# Patient Record
Sex: Female | Born: 2000 | Race: White | Hispanic: No | Marital: Single | Smoking: Never smoker
Health system: Southern US, Community
[De-identification: ages and names within clinical notes are randomized; demographics above are authoritative.]

## PROBLEM LIST (undated history)

## (undated) ENCOUNTER — Emergency Department (HOSPITAL_COMMUNITY): Admission: EM | Payer: Medicaid Other | Source: Home / Self Care

## (undated) DIAGNOSIS — Q100 Congenital ptosis: Secondary | ICD-10-CM

## (undated) DIAGNOSIS — IMO0002 Reserved for concepts with insufficient information to code with codable children: Secondary | ICD-10-CM

## (undated) DIAGNOSIS — F32A Depression, unspecified: Secondary | ICD-10-CM

## (undated) DIAGNOSIS — M419 Scoliosis, unspecified: Secondary | ICD-10-CM

## (undated) DIAGNOSIS — F909 Attention-deficit hyperactivity disorder, unspecified type: Secondary | ICD-10-CM

## (undated) DIAGNOSIS — F840 Autistic disorder: Secondary | ICD-10-CM

## (undated) DIAGNOSIS — F4310 Post-traumatic stress disorder, unspecified: Secondary | ICD-10-CM

## (undated) DIAGNOSIS — F329 Major depressive disorder, single episode, unspecified: Secondary | ICD-10-CM

## (undated) DIAGNOSIS — K219 Gastro-esophageal reflux disease without esophagitis: Secondary | ICD-10-CM

## (undated) DIAGNOSIS — K2970 Gastritis, unspecified, without bleeding: Secondary | ICD-10-CM

## (undated) DIAGNOSIS — F82 Specific developmental disorder of motor function: Secondary | ICD-10-CM

## (undated) DIAGNOSIS — H02409 Unspecified ptosis of unspecified eyelid: Secondary | ICD-10-CM

## (undated) DIAGNOSIS — E43 Unspecified severe protein-calorie malnutrition: Secondary | ICD-10-CM

## (undated) DIAGNOSIS — F509 Eating disorder, unspecified: Secondary | ICD-10-CM

## (undated) HISTORY — DX: Reserved for concepts with insufficient information to code with codable children: IMO0002

## (undated) HISTORY — PX: BELPHAROPTOSIS REPAIR: SHX369

## (undated) HISTORY — PX: OTHER SURGICAL HISTORY: SHX169

## (undated) HISTORY — PX: TYMPANOSTOMY TUBE PLACEMENT: SHX32

## (undated) HISTORY — PX: SPINE SURGERY: SHX786

## (undated) HISTORY — DX: Attention-deficit hyperactivity disorder, unspecified type: F90.9

## (undated) HISTORY — DX: Unspecified severe protein-calorie malnutrition: E43

## (undated) HISTORY — DX: Post-traumatic stress disorder, unspecified: F43.10

## (undated) HISTORY — DX: Gastritis, unspecified, without bleeding: K29.70

## (undated) HISTORY — DX: Unspecified ptosis of unspecified eyelid: H02.409

## (undated) HISTORY — DX: Autistic disorder: F84.0

## (undated) HISTORY — DX: Gastro-esophageal reflux disease without esophagitis: K21.9

## (undated) HISTORY — DX: Eating disorder, unspecified: F50.9

## (undated) HISTORY — PX: EYE SURGERY: SHX253

## (undated) HISTORY — DX: Specific developmental disorder of motor function: F82

## (undated) HISTORY — DX: Depression, unspecified: F32.A

## (undated) HISTORY — DX: Congenital ptosis: Q10.0

---
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
## 2021-01-03 ENCOUNTER — Telehealth: Payer: Self-pay | Admitting: Pediatrics

## 2021-01-03 NOTE — Telephone Encounter (Signed)
Mom would like a call back. She has various questions.

## 2021-01-04 ENCOUNTER — Ambulatory Visit: Payer: Medicaid Other

## 2021-01-04 ENCOUNTER — Other Ambulatory Visit: Payer: Self-pay

## 2021-01-04 NOTE — Telephone Encounter (Signed)
Called number on file, no answer, left VM to call office back. ° °

## 2021-01-04 NOTE — Telephone Encounter (Signed)
Spoke with parent. Was unable to fill script due to ipledge. Time has lapsed- needs new urine preg. Appointment made for lab visit for urine preg and labs.

## 2021-01-05 ENCOUNTER — Other Ambulatory Visit: Payer: Self-pay | Admitting: Pediatrics

## 2021-01-05 DIAGNOSIS — L700 Acne vulgaris: Secondary | ICD-10-CM

## 2021-01-05 MED ORDER — ISOTRETINOIN 30 MG PO CAPS: 30.0000 mg | ORAL_CAPSULE | Freq: Two times a day (BID) | ORAL | 0 refills | Status: DC

## 2021-01-05 NOTE — Telephone Encounter (Signed)
Urine was completed. Rx re-sent and ipledge system updated.

## 2021-01-06 ENCOUNTER — Telehealth: Payer: Self-pay | Admitting: Pediatrics

## 2021-01-06 DIAGNOSIS — R000 Tachycardia, unspecified: Secondary | ICD-10-CM

## 2021-01-06 NOTE — Telephone Encounter (Signed)
Spoke with mom. She picked up Accutane yesterday for patient.

## 2021-01-06 NOTE — Telephone Encounter (Signed)
Referral has been placed in epic 

## 2021-01-06 NOTE — Telephone Encounter (Signed)
Imo was seen by Dr. Armanda Magic who requested referral to cardiac electrophysiology for further evaluation of inappropriate sinus tachycardia. Referred to [facility] cardiac electrophysiology.

## 2021-01-07 LAB — COMPREHENSIVE METABOLIC PANEL
AG Ratio: 1.8 (calc) (ref 1.0–2.5)
ALT: 27 U/L (ref 5–32)
AST: 26 U/L (ref 12–32)
Albumin: 4.3 g/dL (ref 3.6–5.1)
Alkaline phosphatase (APISO): 96 U/L (ref 36–128)
BUN: 17 mg/dL (ref 7–20)
CO2: 20 mmol/L (ref 20–32)
Calcium: 9.4 mg/dL (ref 8.9–10.4)
Chloride: 110 mmol/L (ref 98–110)
Creat: 0.88 mg/dL (ref 0.50–1.00)
Globulin: 2.4 g/dL (calc) (ref 2.0–3.8)
Glucose, Bld: 91 mg/dL (ref 65–99)
Potassium: 4 mmol/L (ref 3.8–5.1)
Sodium: 141 mmol/L (ref 135–146)
Total Bilirubin: 0.4 mg/dL (ref 0.2–1.1)
Total Protein: 6.7 g/dL (ref 6.3–8.2)

## 2021-01-07 LAB — T4, FREE: Free T4: 1.1 ng/dL (ref 0.8–1.4)

## 2021-01-07 LAB — PREGNANCY, URINE: Preg Test, Ur: NEGATIVE

## 2021-01-07 LAB — TRIGLYCERIDES: Triglycerides: 102 mg/dL — ABNORMAL HIGH (ref <90)

## 2021-01-07 LAB — THYROGLOBULIN ANTIBODY: Thyroglobulin Ab: 1 IU/mL (ref <=1)

## 2021-01-07 LAB — TSH: TSH: 3.02 mIU/L

## 2021-01-07 LAB — THYROID STIMULATING IMMUNOGLOBULIN: TSI: <89 % baseline (ref <140)

## 2021-01-07 LAB — THYROID PEROXIDASE ANTIBODIES (TPO) (REFL): Thyroperoxidase Ab SerPl-aCnc: 4 IU/mL (ref <9)

## 2021-01-11 ENCOUNTER — Other Ambulatory Visit: Payer: Self-pay

## 2021-01-11 ENCOUNTER — Ambulatory Visit (INDEPENDENT_AMBULATORY_CARE_PROVIDER_SITE_OTHER): Payer: Medicaid Other

## 2021-01-11 DIAGNOSIS — Z3042 Encounter for surveillance of injectable contraceptive: Secondary | ICD-10-CM

## 2021-01-11 MED ORDER — MEDROXYPROGESTERONE ACETATE 150 MG/ML IM SUSP
150.0000 mg | Freq: Once | INTRAMUSCULAR | Status: AC
  Administered 2021-01-11: 150 mg via INTRAMUSCULAR

## 2021-01-11 NOTE — Progress Notes (Signed)
Pt presents for depo injection. Pt within depo window, no urine hcg needed. Injection given, tolerated well. F/u depo injection visit scheduled.   

## 2021-01-24 ENCOUNTER — Other Ambulatory Visit: Payer: Self-pay

## 2021-01-24 ENCOUNTER — Other Ambulatory Visit: Payer: Self-pay | Admitting: Pediatrics

## 2021-01-24 ENCOUNTER — Ambulatory Visit (INDEPENDENT_AMBULATORY_CARE_PROVIDER_SITE_OTHER): Payer: Medicaid Other

## 2021-01-24 DIAGNOSIS — Z3202 Encounter for pregnancy test, result negative: Secondary | ICD-10-CM

## 2021-01-24 DIAGNOSIS — L700 Acne vulgaris: Secondary | ICD-10-CM

## 2021-01-24 LAB — POCT URINE PREGNANCY: Preg Test, Ur: NEGATIVE

## 2021-01-24 MED ORDER — ISOTRETINOIN 30 MG PO CAPS: 30.0000 mg | ORAL_CAPSULE | Freq: Two times a day (BID) | ORAL | 0 refills | Status: DC

## 2021-01-24 NOTE — Progress Notes (Signed)
Pt here for urine pregnancy for accutane therapy. Specimen obtained and negative. Sending to [facility] to update ipledge.

## 2021-01-25 ENCOUNTER — Institutional Professional Consult (permissible substitution): Payer: Medicaid Other | Admitting: Internal Medicine

## 2021-02-01 ENCOUNTER — Telehealth: Payer: Self-pay | Admitting: *Deleted

## 2021-02-01 DIAGNOSIS — I771 Stricture of artery: Secondary | ICD-10-CM

## 2021-02-01 DIAGNOSIS — R000 Tachycardia, unspecified: Secondary | ICD-10-CM

## 2021-02-01 DIAGNOSIS — Z01812 Encounter for preprocedural laboratory examination: Secondary | ICD-10-CM

## 2021-02-01 NOTE — Telephone Encounter (Signed)
Hey Carlyle/Triage  Could you reach out to see if this patient can get a CBC for this cardiac MRI on Thursday, July 7?  Dr. Bjorn Pippin is asking for them so he can more accurately do his measurements on MRIs.   Thanks,  Alphonsa Overall with pt's mother [name] - DPR on file.  She is aware of need for lab work and to come in on Thursday.  She is aware of location of office.

## 2021-02-02 ENCOUNTER — Telehealth (HOSPITAL_COMMUNITY): Payer: Self-pay | Admitting: *Deleted

## 2021-02-02 NOTE — Telephone Encounter (Signed)
Attempted to call patient regarding upcoming cardiac MRI appointment. Left message on voicemail with name and callback number  Myli Pae RN Navigator Cardiac Imaging [facility] [phone] Office [phone] Cell  

## 2021-02-03 ENCOUNTER — Ambulatory Visit (HOSPITAL_COMMUNITY)
Admission: RE | Admit: 2021-02-03 | Discharge: 2021-02-03 | Disposition: A | Discharge status: Home / Self Care | Payer: Medicaid Other | Source: Ambulatory Visit | Attending: Cardiology | Admitting: Cardiology

## 2021-02-03 ENCOUNTER — Other Ambulatory Visit: Payer: Self-pay

## 2021-02-03 ENCOUNTER — Other Ambulatory Visit: Payer: Medicaid Other

## 2021-02-03 DIAGNOSIS — Z01812 Encounter for preprocedural laboratory examination: Secondary | ICD-10-CM

## 2021-02-03 DIAGNOSIS — Q676 Pectus excavatum: Secondary | ICD-10-CM | POA: Insufficient documentation

## 2021-02-03 DIAGNOSIS — R000 Tachycardia, unspecified: Secondary | ICD-10-CM

## 2021-02-03 DIAGNOSIS — I771 Stricture of artery: Secondary | ICD-10-CM

## 2021-02-03 LAB — CBC
Hematocrit: 41 % (ref 34.0–46.6)
Hemoglobin: 13.5 g/dL (ref 11.1–15.9)
MCH: 25.7 pg — ABNORMAL LOW (ref 26.6–33.0)
MCHC: 32.9 g/dL (ref 31.5–35.7)
MCV: 78 fL — ABNORMAL LOW (ref 79–97)
Platelets: 303 10*3/uL (ref 150–450)
RBC: 5.25 x10E6/uL (ref 3.77–5.28)
RDW: 13.1 % (ref 11.7–15.4)
WBC: 8.5 10*3/uL (ref 3.4–10.8)

## 2021-02-03 MED ORDER — GADOBUTROL 1 MMOL/ML IV SOLN
7.0000 mL | Freq: Once | INTRAVENOUS | Status: AC | PRN
  Administered 2021-02-03: 7 mL via INTRAVENOUS

## 2021-02-04 ENCOUNTER — Telehealth: Payer: Self-pay | Admitting: Pediatrics

## 2021-02-04 NOTE — Telephone Encounter (Signed)
CALL BACK NUMBER:  [phone]  MEDICATION(S): ISOtretinoin (ACCUTANE) 30 MG capsule   ARE YOU CURRENTLY COMPLETELY OUT OF THE MEDICATION? :  yes

## 2021-02-07 ENCOUNTER — Telehealth: Payer: Self-pay | Admitting: *Deleted

## 2021-02-07 ENCOUNTER — Telehealth: Payer: Self-pay

## 2021-02-07 NOTE — Telephone Encounter (Signed)
Agree with CMA note 

## 2021-02-07 NOTE — Telephone Encounter (Signed)
Called mother back and suggested to go to [facility] after hours so they can do X-rays and evaluate everything. Mother agreed.

## 2021-02-07 NOTE — Telephone Encounter (Signed)
Spoke to [name] [name] today. [name] fell July 7 in the bathroom and still has pain in her arm.Advised her to call PCP [name] for advice/appointment.    She also has been without Accutane for 3 days now and needs a new prescription.She has had trouble refilling the 3rd prescription.

## 2021-02-08 ENCOUNTER — Other Ambulatory Visit: Payer: Self-pay | Admitting: Gastroenterology

## 2021-02-08 DIAGNOSIS — R1310 Dysphagia, unspecified: Secondary | ICD-10-CM

## 2021-02-08 DIAGNOSIS — K2970 Gastritis, unspecified, without bleeding: Secondary | ICD-10-CM

## 2021-02-09 NOTE — Progress Notes (Addendum)
Cardiology Note    Date:  02/10/2021   ID:  [name], DOB 2000/08/05, MRN [id number]  PCP:  Estelle June, NP  Cardiologist:  Armanda Magic, MD   Chief Complaint  Patient presents with   Follow-up    Tachycardia, genetic abnormality, autism, pectus excavatum     History of Present Illness:  [name] is a 20 y.o. female  with a hx of ADHD, autism spectrum disorder, congenital ptosis of the left eyelid, depression, fine motor development delay, GERD, poor fetal growth and scoliosis.  2D echo 12/2017 showed extrinsic compression of the RA and RV from pectus excavatum deformity of the chest wall. She is now here to establish cardiac care.   She has been evaluated by a Geneticists due to poor weight gain, severe malnutrition, major depressive disorder, verbal auditory hallucinations and autism spectrum disorder. Her medical history also includes scoliosis requiring surgical intervention, bilateral ptosis s/p unilateral repair, H. Pylori infection, and speech delay. Growth parameters showed weight 82% for an 20 year old, head circumference 79% for a 37-33 year old (microcephaly) with preserved height. Physical examination was notable for dysmorphic features including ptosis, straight long eyelashes, irregular dentition, prominent large ears with thin flat helices, scoliosis, 5th finger clinodactyly and acne. Genetic evaluation with a chromosomal microarray and Fragile X testing, which were both normal.  A whole exome sequencing, which was performed as a trio (meaning samples from mom and dad were included for the analysis). 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   Apparently per the geneticist, Heterozygous missense variants with evidence of loss of function on functional analysis have been reported in association with early onset coronary  artery disease (CAD), including in at least two families in which the variant segregated with the phenotype in multiple affected family members Urban Gibson et al., 2007; Xu et al., 2014; Roetta Sessions et al., 2016). De novo heterozygous variants in SON have been reported in individuals with intellectual disability, developmental regression, autism, brain anomalies, hypotonia, seizures, eye or vision anomalies, short stature, and facial dysmorphism including midface retraction, deep set eyes, downslanting palpebral fissures, and short or smooth philtrum. Other features described in some individuals include intrauterine growth restriction, feeding difficulties, failure to thrive, gastrointestinal malformations or other issues, joint hypermobility, scoliosis or kyphosis, craniosynostosis, kidney abnormalities, genitourinary malformations, and cardiac malformations   She initially presented for evaluation for possible  cardiac malformations and other associated cardiac diseases such as CAD that can be associated with her genetic abnormalities.Cardiac MRI was done which was normal with no compression of the RV or RA.  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 pylori infection 04/01/2020   Poor fetal growth    Ptosis    PTSD (post-traumatic stress disorder)    Scoliosis    Severe protein-calorie malnutrition [name]: less than 60% of standard weight) (HCC)     Past Surgical History:  Procedure Laterality Date  BELPHAROPTOSIS REPAIR Left    Dr. Aura Camps   EYE SURGERY N/A    Phreesia 07/19/2020   scoliosis surgery   05/2017   [facility]   SPINE SURGERY N/A    Phreesia 12/30/2019   TYMPANOSTOMY TUBE PLACEMENT     1 1/19 yrs old    Current Medications: Current Meds  Medication Sig   esomeprazole (NEXIUM) 40 MG capsule SPRINKLE CAPSULE CONTENTS INTO APPLESAUCE IF UNABLE TO SWALLOW PILLS.   Ginkgo Biloba 40 MG TABS Take by mouth.   ISOtretinoin (ACCUTANE) 30 MG capsule Take 1 capsule (30 mg total) by mouth 2 (two) times daily.   l-methylfolate-B6-B12 (METANX) 3-35-2 MG TABS tablet Take 1 tablet by mouth daily.   lamoTRIgine (LAMICTAL) 200 MG tablet Take 200 mg by mouth daily.   levothyroxine (SYNTHROID) 50 MCG tablet Take 50 mcg by mouth daily before breakfast.   medroxyPROGESTERone (DEPO-PROVERA) 150 MG/ML injection Inject 150 mg into the muscle every 3 (three) months.    OLANZapine (ZYPREXA) 15 MG tablet Take 15 mg by mouth at bedtime.    Allergies:   Patient has no known allergies.   Social History   Socioeconomic History   Marital status: Single    Spouse name: Not on file   Number of children: 0   Years of education: Not on file   Highest education level: Not on file  Occupational History   Not on file  Tobacco Use   Smoking status: Passive Smoke Exposure - Never Smoker   Smokeless tobacco: Never   Tobacco comments:    Mom and dad smoke in home  Vaping Use   Vaping Use: Never used  Substance and Sexual Activity   Alcohol use: No    Alcohol/week: 0.0 standard drinks   Drug use: No   Sexual activity: Never    Birth control/protection: Abstinence  Other Topics Concern   Not on file  Social History Narrative   11/11/19 Lives with mom and brother. Dad no longer in the home. Cat and dog in household. Day school program. Last offically finished school 10th grade.   Social Determinants of Health   Financial Resource Strain: Not on file  Food Insecurity: Not on file  Transportation Needs: Not on file  Physical Activity: Not on file  Stress: Not on file  Social  Connections: Not on file     Family History:  The patient's family history includes Asthma in her maternal grandmother; Cancer in her maternal grandfather and paternal grandfather; Diabetes in her maternal grandfather and maternal grandmother; Heart disease in her maternal grandfather; Hyperlipidemia in her maternal grandfather; Hypertension in her maternal grandfather and maternal grandmother.   ROS:   Please see the history of present illness.    ROS All other systems reviewed and are negative.  No flowsheet data found.  PHYSICAL EXAM:   VS:  BP 120/80 (BP Location: Left Arm, Patient Position: Sitting, Cuff Size: Normal)   Pulse (!) 116   Ht 5' (1.524 m)   Wt 144 lb (65.3 kg)   SpO2 98%   BMI 28.12 kg/m     GEN: Well nourished, well developed in no acute distress HEENT: Normal NECK: No JVD; No carotid bruits LYMPHATICS: No lymphadenopathy CARDIAC:RRR, no murmurs, rubs, gallops RESPIRATORY:  Clear to auscultation without rales, wheezing or rhonchi  ABDOMEN: Soft, non-tender, non-distended MUSCULOSKELETAL:  No edema; No deformity  SKIN: Warm and dry NEUROLOGIC:  Alert and oriented x 3 PSYCHIATRIC:  Normal affect    Wt  Readings from Last 3 Encounters:  02/10/21 144 lb (65.3 kg) (75 %, Z= 0.68)*  12/21/20 137 lb 9.6 oz (62.4 kg) (68 %, Z= 0.46)*  12/15/20 138 lb (62.6 kg) (68 %, Z= 0.47)*   * Growth percentiles are based on CDC (Girls, 2-20 Years) data.      Studies/Labs Reviewed:   EKG:  EKG is not ordered today.   Recent Labs: 03/15/2020: Magnesium 2.1 01/04/2021: ALT 27; BUN 17; Creat 0.88; Potassium 4.0; Sodium 141; TSH 3.02 02/03/2021: Hemoglobin 13.5; Platelets 303   Lipid Panel    Component Value Date/Time   CHOL 163 12/01/2020 0000   TRIG 102 (H) 01/04/2021 0954   HDL 48 12/01/2020 0000   CHOLHDL 3.4 12/01/2020 0000   LDLCALC 99 12/01/2020 0000   Additional studies/ records that were reviewed today include:  cMRI  ASSESSMENT:    1. Pectus excavatum    2. Extrinsic compression of artery (HCC)   3. Abnormal genetic test   4. Tachycardia      PLAN:  In order of problems listed above:  1. Pectus excavatum -this is not very impressive on physical exam -2D echo showed some compression from the sternum on the right heart but no compression on cMRI  2.  RV and RA extrinsic compression -I have personally reviewed and interpreted outside 2D echo performed by patient's prior pediatric cardiologist which showed extrinsic compression of the RA and RV due to pectus excavatum from 2019 but there was no evidence of RA or RV dilatation and no pulmonary HTN -Cardiac MRI recently done was completely normal  3.  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  -some have these have been found to develop premature CAD and other cardiac structural problems -cardiac MRI was completely normal -normal coronary artery takeoff was noted on 2D echo in 2019  4.  Tachycardia -thyroid tests were normal -3 day ziopatch showed average HR 117bpm and she was referred to EP.  -she see Dr. Ladona Ridgel in August -orthostatics have been neg in the past   Medication Adjustments/Labs and Tests Ordered: Current medicines are reviewed at length with the patient today.  Concerns regarding medicines are outlined above.  Medication changes, Labs and Tests ordered today are listed in the Patient Instructions below.  There are no Patient Instructions on file for this visit.    Signed, Armanda Magic, MD  02/10/2021 9:52 AM    [facility] [address] [place], [place], [place]  [address] Phone: [phone]; Fax: [phone]

## 2021-02-10 ENCOUNTER — Other Ambulatory Visit: Payer: Self-pay

## 2021-02-10 ENCOUNTER — Ambulatory Visit (INDEPENDENT_AMBULATORY_CARE_PROVIDER_SITE_OTHER): Payer: Medicaid Other | Admitting: Cardiology

## 2021-02-10 VITALS — BP 120/80 | HR 116 | Ht 60.0 in | Wt 144.0 lb

## 2021-02-10 DIAGNOSIS — Q676 Pectus excavatum: Secondary | ICD-10-CM | POA: Diagnosis not present

## 2021-02-10 DIAGNOSIS — I771 Stricture of artery: Secondary | ICD-10-CM | POA: Diagnosis not present

## 2021-02-10 DIAGNOSIS — R898 Other abnormal findings in specimens from other organs, systems and tissues: Secondary | ICD-10-CM | POA: Diagnosis not present

## 2021-02-10 DIAGNOSIS — R000 Tachycardia, unspecified: Secondary | ICD-10-CM | POA: Diagnosis not present

## 2021-02-10 NOTE — Patient Instructions (Signed)
Medication Instructions:  Your physician recommends that you continue on your current medications as directed. Please refer to the Current Medication list given to you today.  *If you need a refill on your cardiac medications before your next appointment, please call your pharmacy*   Lab Work: None ordered   If you have labs (blood work) drawn today and your tests are completely normal, you will receive your results only by: MyChart Message (if you have MyChart) OR A paper copy in the mail If you have any lab test that is abnormal or we need to change your treatment, we will call you to review the results.   Testing/Procedures: None ordered    Follow-Up: Follow up with Dr.Traci Turner as needed   Other Instructions None ordered

## 2021-02-11 ENCOUNTER — Ambulatory Visit: Payer: Medicaid Other | Admitting: Cardiology

## 2021-02-11 ENCOUNTER — Telehealth: Payer: Self-pay | Admitting: Pediatrics

## 2021-02-11 NOTE — Telephone Encounter (Signed)
Mom states he has a problem every month with the pharmacy about the RX Accutane, can you please call mom back.

## 2021-02-14 ENCOUNTER — Other Ambulatory Visit: Payer: Self-pay | Admitting: Pediatrics

## 2021-02-14 ENCOUNTER — Other Ambulatory Visit: Payer: Self-pay

## 2021-02-14 ENCOUNTER — Ambulatory Visit (INDEPENDENT_AMBULATORY_CARE_PROVIDER_SITE_OTHER): Payer: Medicaid Other

## 2021-02-14 DIAGNOSIS — Z79899 Other long term (current) drug therapy: Secondary | ICD-10-CM

## 2021-02-14 DIAGNOSIS — Z3202 Encounter for pregnancy test, result negative: Secondary | ICD-10-CM

## 2021-02-14 DIAGNOSIS — L700 Acne vulgaris: Secondary | ICD-10-CM

## 2021-02-14 LAB — POCT URINE PREGNANCY: Preg Test, Ur: NEGATIVE

## 2021-02-14 MED ORDER — ISOTRETINOIN 30 MG PO CAPS: 30.0000 mg | ORAL_CAPSULE | Freq: Two times a day (BID) | ORAL | 0 refills | Status: DC

## 2021-02-14 NOTE — Telephone Encounter (Signed)
Spoke with parent. She is completely out of accutane and having breakouts again. Pt needs updated urine preg. Made nurse only appointment. Also Accutane needs to be sent for TID instead of BID. Reassured mother once ipledge is updated by provider will call pharmacy to ensure script goes through.

## 2021-02-14 NOTE — Progress Notes (Signed)
Pt here for urine pregnancy for accutane therapy. Specimen obtained and negative. Sending to Caroline Hacker,FNP-C to update ipledge. 

## 2021-02-14 NOTE — Telephone Encounter (Addendum)
Spoke with [name]. Accutane needs to be BID and not TID. Script was sent. Called pharmacy and verified script went through with insurance. Will call parent and made them aware as well.

## 2021-02-22 ENCOUNTER — Ambulatory Visit: Payer: Medicaid Other | Admitting: Pediatrics

## 2021-03-09 ENCOUNTER — Encounter: Payer: Self-pay | Admitting: Internal Medicine

## 2021-03-09 ENCOUNTER — Other Ambulatory Visit: Payer: Self-pay

## 2021-03-09 ENCOUNTER — Ambulatory Visit: Payer: Medicaid Other | Admitting: Internal Medicine

## 2021-03-09 VITALS — BP 122/78 | HR 121 | Ht 60.0 in | Wt 145.6 lb

## 2021-03-09 DIAGNOSIS — R000 Tachycardia, unspecified: Secondary | ICD-10-CM | POA: Diagnosis not present

## 2021-03-09 NOTE — Progress Notes (Signed)
HPI Ms. Bohannon is referred today by Dr. Mayford Knife for evaluation of sinus tachycardia. She is a pleasant 20 yo woman with a h/o scoliosis and an unspecified autism. She weight 80 lbs 2 years ago but has gradually gained over 60 lbs in the past 24 months as she has started drinking sugar sweetened beverages. She has fatigue and feels palpitations and is tired. A 2D echo demonstrated normal LV function. She admits to consuming excess caffeine. A 2D echo demonstrated normal LV function. The patient has not had syncope. She was noted to have resting sinus tachycardia and a cardiac monitor demonstrated an ave HR of around 120/min.  No Known Allergies   Current Outpatient Medications  Medication Sig Dispense Refill   esomeprazole (NEXIUM) 40 MG capsule SPRINKLE CAPSULE CONTENTS INTO APPLESAUCE IF UNABLE TO SWALLOW PILLS. 90 capsule 1   Ginkgo Biloba 40 MG TABS Take by mouth.     ISOtretinoin (ACCUTANE) 30 MG capsule Take 1 capsule (30 mg total) by mouth 2 (two) times daily. 60 capsule 0   l-methylfolate-B6-B12 (METANX) 3-35-2 MG TABS tablet Take 1 tablet by mouth daily. 30 tablet 3   lamoTRIgine (LAMICTAL) 200 MG tablet Take 200 mg by mouth daily.     levothyroxine (SYNTHROID) 50 MCG tablet Take 50 mcg by mouth daily before breakfast.     medroxyPROGESTERone (DEPO-PROVERA) 150 MG/ML injection Inject 150 mg into the muscle every 3 (three) months.      OLANZapine (ZYPREXA) 15 MG tablet Take 15 mg by mouth at bedtime.     No current facility-administered medications for this visit.     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 pylori infection 04/01/2020   Poor fetal growth    Ptosis    PTSD (post-traumatic stress disorder)    Scoliosis    Severe protein-calorie malnutrition [name]: less than 60% of standard weight) (HCC)     ROS:   All systems reviewed and negative except as noted in the HPI.   Past Surgical History:  Procedure Laterality Date   BELPHAROPTOSIS REPAIR Left    Dr. Aura Camps   EYE SURGERY N/A    Phreesia 07/19/2020   scoliosis surgery  05/2017   [facility]   SPINE SURGERY N/A    Phreesia 12/30/2019   TYMPANOSTOMY TUBE PLACEMENT     1 1/19 yrs old     Family History  Problem Relation Age of Onset   Hypertension Maternal Grandmother    Diabetes Maternal Grandmother    Asthma Maternal Grandmother    Cancer Maternal Grandfather        blood   Diabetes Maternal Grandfather    Hypertension Maternal Grandfather    Heart disease Maternal Grandfather    Hyperlipidemia Maternal Grandfather    Cancer Paternal Grandfather        Lymphoma   Alcohol abuse Neg Hx    Arthritis Neg Hx    Birth defects Neg Hx    COPD Neg Hx    Depression Neg Hx  Drug abuse Neg Hx    Early death Neg Hx    Hearing loss Neg Hx    Kidney disease Neg Hx    Learning disabilities Neg Hx    Mental illness Neg Hx    Mental retardation Neg Hx    Miscarriages / Stillbirths Neg Hx    Stroke Neg Hx    Vision loss Neg Hx    Varicose Veins Neg Hx    Colon cancer Neg Hx    Esophageal cancer Neg Hx    Rectal cancer Neg Hx    Stomach cancer Neg Hx      Social History   Socioeconomic History   Marital status: Single    Spouse name: Not on file   Number of children: 0   Years of education: Not on file   Highest education level: Not on file  Occupational History   Not on file  Tobacco Use   Smoking status: Never    Passive exposure: Yes   Smokeless tobacco: Never   Tobacco comments:    Mom and dad smoke in home  Vaping Use   Vaping Use:  Never used  Substance and Sexual Activity   Alcohol use: No    Alcohol/week: 0.0 standard drinks   Drug use: No   Sexual activity: Never    Birth control/protection: Abstinence  Other Topics Concern   Not on file  Social History Narrative   11/11/19 Lives with mom and brother. Dad no longer in the home. Cat and dog in household. Day school program. Last offically finished school 10th grade.   Social Determinants of Health   Financial Resource Strain: Not on file  Food Insecurity: Not on file  Transportation Needs: Not on file  Physical Activity: Not on file  Stress: Not on file  Social Connections: Not on file  Intimate Partner Violence: Not on file     BP 122/78   Pulse (!) 121   Ht 5' (1.524 m)   Wt 145 lb 9.6 oz (66 kg)   SpO2 95%   BMI 28.44 kg/m   Physical Exam:  Well appearing young woman, NAD HEENT: Unremarkable Neck:  No JVD, no thyromegally Lymphatics:  No adenopathy Back:  No CVA tenderness Lungs:  Clear with no wheezes HEART:  Regular tachy rhythm, no murmurs, no rubs, no clicks Abd:  soft, positive bowel sounds, no organomegally, no rebound, no guarding Ext:  2 plus pulses, no edema, no cyanosis, no clubbing Skin:  No rashes no nodules Neuro:  CN II through XII intact, motor grossly intact   Assess/Plan:  Sinus tachycardia - I think that she has some autonomic dysfunction and is deconditioned and is drinking too many sweetened beverages. I asked her to start walking 30 minutes a day. I asked her to stop drinking sweet sodas and juices and to drink water and milk and to avoid caffeine. I asked her to eat more fresh fruit. Lewayne Bunting, MD

## 2021-03-09 NOTE — Patient Instructions (Addendum)
Medication Instructions:  °Your physician recommends that you continue on your current medications as directed. Please refer to the Current Medication list given to you today. ° °Labwork: °None ordered. ° °Testing/Procedures: °None ordered. ° °Follow-Up: °Your physician wants you to follow-up in: 6 months with Gregg Taylor, MD  ° ° °Any Other Special Instructions Will Be Listed Below (If Applicable). ° °If you need a refill on your cardiac medications before your next appointment, please call your pharmacy.  ° ° ° ° °

## 2021-03-15 ENCOUNTER — Other Ambulatory Visit: Payer: Self-pay | Admitting: Pediatrics

## 2021-03-15 DIAGNOSIS — L700 Acne vulgaris: Secondary | ICD-10-CM

## 2021-03-16 ENCOUNTER — Other Ambulatory Visit: Payer: Self-pay

## 2021-03-16 ENCOUNTER — Other Ambulatory Visit: Payer: Self-pay | Admitting: Pediatrics

## 2021-03-16 ENCOUNTER — Ambulatory Visit: Payer: Medicaid Other

## 2021-03-16 DIAGNOSIS — L700 Acne vulgaris: Secondary | ICD-10-CM

## 2021-03-16 DIAGNOSIS — Z3202 Encounter for pregnancy test, result negative: Secondary | ICD-10-CM

## 2021-03-16 LAB — POCT URINE PREGNANCY: Preg Test, Ur: NEGATIVE

## 2021-03-16 MED ORDER — ISOTRETINOIN 30 MG PO CAPS: 30.0000 mg | ORAL_CAPSULE | Freq: Two times a day (BID) | ORAL | 0 refills | Status: DC

## 2021-03-16 NOTE — Progress Notes (Signed)
PT CAME IN TODAY BC SHE IS OUT OF ACCUTANE AND DAD STATED THAT SHE NEEDS IT ASAP. IF YOU COULD PLEASE RX TODAY SO DAD CAN PICK UP TOMORROW THAT WOULD BE GREAT. THANKS!  DAD IS ASKING IF HE COULD POSSIBLY GET A PHONE CALL TO DISCUSS APPT'S AND SHE IS ALWAYS RUNNING BEFORE. [phone]

## 2021-03-16 NOTE — Progress Notes (Unsigned)
Received call asking for refill of accutane. Completely out today. Urine pregnancy test negative. Routing to provider to update ipledge and refill asap.

## 2021-03-29 ENCOUNTER — Ambulatory Visit (INDEPENDENT_AMBULATORY_CARE_PROVIDER_SITE_OTHER): Payer: Medicaid Other

## 2021-03-29 ENCOUNTER — Other Ambulatory Visit: Payer: Self-pay

## 2021-03-29 DIAGNOSIS — Z3042 Encounter for surveillance of injectable contraceptive: Secondary | ICD-10-CM

## 2021-03-29 MED ORDER — MEDROXYPROGESTERONE ACETATE 150 MG/ML IM SUSP
150.0000 mg | Freq: Once | INTRAMUSCULAR | Status: AC
  Administered 2021-03-29: 150 mg via INTRAMUSCULAR

## 2021-03-29 NOTE — Progress Notes (Signed)
Pt presents for depo injection. Pt within depo window, no urine hcg needed. Injection given, tolerated well. F/u depo injection visit scheduled.   Caroline: please check over dates for Accutane therapy and urine pregnancy to make sure they are correct.

## 2021-04-11 ENCOUNTER — Ambulatory Visit: Payer: Medicaid Other

## 2021-04-14 ENCOUNTER — Ambulatory Visit (INDEPENDENT_AMBULATORY_CARE_PROVIDER_SITE_OTHER): Payer: Medicaid Other

## 2021-04-14 DIAGNOSIS — Z3202 Encounter for pregnancy test, result negative: Secondary | ICD-10-CM | POA: Diagnosis not present

## 2021-04-14 LAB — POCT URINE PREGNANCY: Preg Test, Ur: NEGATIVE

## 2021-04-14 NOTE — Progress Notes (Addendum)
Pt here for urine pregnancy for Accutane refill. Routing to provider to update ipledge and send refill.

## 2021-04-18 ENCOUNTER — Other Ambulatory Visit: Payer: Self-pay | Admitting: Pediatrics

## 2021-04-18 ENCOUNTER — Telehealth: Payer: Self-pay

## 2021-04-18 DIAGNOSIS — L700 Acne vulgaris: Secondary | ICD-10-CM

## 2021-04-18 MED ORDER — ISOTRETINOIN 30 MG PO CAPS: 30.0000 mg | ORAL_CAPSULE | Freq: Two times a day (BID) | ORAL | 0 refills | Status: DC

## 2021-04-18 NOTE — Telephone Encounter (Signed)
Pt came in Thursday for urine pregnancy and needed ipledge updated and refill. Refill and ipledge not updated and pt did not receive medication and did not get the med on Sunday. Mom asks for med to be sent ASAP.

## 2021-04-18 NOTE — Telephone Encounter (Signed)
Done

## 2021-04-18 NOTE — Telephone Encounter (Signed)
Called and spoke with pharmacy. RX went through as paid claim. Pharmacy states they will contact patient to let them know it is ready for pickup.

## 2021-05-17 ENCOUNTER — Other Ambulatory Visit: Payer: Self-pay

## 2021-05-17 ENCOUNTER — Other Ambulatory Visit: Payer: Self-pay | Admitting: Pediatrics

## 2021-05-17 ENCOUNTER — Ambulatory Visit (INDEPENDENT_AMBULATORY_CARE_PROVIDER_SITE_OTHER): Payer: Medicaid Other

## 2021-05-17 DIAGNOSIS — Z3202 Encounter for pregnancy test, result negative: Secondary | ICD-10-CM | POA: Diagnosis not present

## 2021-05-17 DIAGNOSIS — L700 Acne vulgaris: Secondary | ICD-10-CM

## 2021-05-17 LAB — POCT URINE PREGNANCY: Preg Test, Ur: NEGATIVE

## 2021-05-17 MED ORDER — ISOTRETINOIN 30 MG PO CAPS: 30.0000 mg | ORAL_CAPSULE | Freq: Two times a day (BID) | ORAL | 0 refills | Status: DC

## 2021-05-17 NOTE — Progress Notes (Signed)
Pt here for urine pregnancy for Accutane refill. Routing to provider to update ipledge and send refill.  

## 2021-06-14 ENCOUNTER — Ambulatory Visit (INDEPENDENT_AMBULATORY_CARE_PROVIDER_SITE_OTHER): Payer: Medicaid Other

## 2021-06-14 ENCOUNTER — Other Ambulatory Visit: Payer: Self-pay

## 2021-06-14 DIAGNOSIS — Z3202 Encounter for pregnancy test, result negative: Secondary | ICD-10-CM | POA: Diagnosis not present

## 2021-06-14 DIAGNOSIS — Z3042 Encounter for surveillance of injectable contraceptive: Secondary | ICD-10-CM

## 2021-06-14 LAB — POCT URINE PREGNANCY: Preg Test, Ur: NEGATIVE

## 2021-06-14 MED ORDER — MEDROXYPROGESTERONE ACETATE 150 MG/ML IM SUSP
150.0000 mg | Freq: Once | INTRAMUSCULAR | Status: AC
  Administered 2021-06-14: 150 mg via INTRAMUSCULAR

## 2021-06-14 NOTE — Progress Notes (Signed)
Pt presents for depo injection. Pt within depo window, no urine hcg needed. Injection given, tolerated well. F/u depo injection visit scheduled. Urine preg negative to update ipledge.

## 2021-06-17 ENCOUNTER — Ambulatory Visit (INDEPENDENT_AMBULATORY_CARE_PROVIDER_SITE_OTHER): Payer: Medicaid Other | Admitting: Pediatrics

## 2021-06-17 ENCOUNTER — Other Ambulatory Visit: Payer: Self-pay

## 2021-06-17 VITALS — Wt 143.0 lb

## 2021-06-17 DIAGNOSIS — R300 Dysuria: Secondary | ICD-10-CM

## 2021-06-17 DIAGNOSIS — F339 Major depressive disorder, recurrent, unspecified: Secondary | ICD-10-CM

## 2021-06-17 DIAGNOSIS — R7303 Prediabetes: Secondary | ICD-10-CM

## 2021-06-17 LAB — POCT URINALYSIS DIPSTICK
Bilirubin, UA: NEGATIVE
Glucose, UA: POSITIVE — AB
Ketones, UA: NEGATIVE
Leukocytes, UA: NEGATIVE
Nitrite, UA: NEGATIVE
Protein, UA: NEGATIVE
Spec Grav, UA: 1.02 (ref 1.010–1.025)
Urobilinogen, UA: 0.2 E.U./dL
pH, UA: 6 (ref 5.0–8.0)

## 2021-06-17 LAB — GLUCOSE, POCT (MANUAL RESULT ENTRY): POC Glucose: 239 mg/dl — AB (ref 70–99)

## 2021-06-17 MED ORDER — CEPHALEXIN 500 MG PO CAPS: 500.0000 mg | ORAL_CAPSULE | Freq: Two times a day (BID) | ORAL | 0 refills | Status: AC

## 2021-06-18 ENCOUNTER — Ambulatory Visit (INDEPENDENT_AMBULATORY_CARE_PROVIDER_SITE_OTHER): Payer: Medicaid Other | Admitting: Pediatrics

## 2021-06-18 DIAGNOSIS — R7303 Prediabetes: Secondary | ICD-10-CM

## 2021-06-19 ENCOUNTER — Encounter: Payer: Self-pay | Admitting: Pediatrics

## 2021-06-19 DIAGNOSIS — R300 Dysuria: Secondary | ICD-10-CM | POA: Insufficient documentation

## 2021-06-19 DIAGNOSIS — R7303 Prediabetes: Secondary | ICD-10-CM | POA: Insufficient documentation

## 2021-06-19 NOTE — Progress Notes (Signed)
Here today for lab draw --was unbale to get blood yesterday

## 2021-06-19 NOTE — Progress Notes (Incomplete)
Subjective:5   Patient Name: [name] Date of Birth: 2001/05/12  MRN: [id number]  [name]  presents to the office today, in referral from Dr. Laurice Record, for initial endocrine consultation for the chief complaint of her new-onset DM  HISTORY OF PRESENT ILLNESS:   [name] is a 20 y.o. *** .  [name] was accompanied by her ***   1. Present illness:  A. her No chief complaint on file. :  B. Pertinent past medical history:   1). Medical problems:    A). Height was at the 4.85% at age 34, increased to the 9.59% at age 25, and has plateaued at the 4.67%.    B). Weight was below he 3% through age 72, increased to the 2.85% at age 45, decreased to the , 0,01% from age [age], and had increased dramatically since then to the 73%.      C). BMI curve followed the weight curve. Her most recent BMI was at the 90.97    D). Idiopathic thoracolumbar scoliosis    E). Hypothyroidism:      F). Acne, treated with accutane    G). Genetic defect: paternally derived FLG abnormality, other VUS     H). Major depressive disorder:     I). 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 Anorexia, self-imposed food restriction, severe protein-calorie malnutrition    L). ADHD, anxiety, PTSD, hallucinations, intellectual disability    M). Tachycardia:     N). Diet: Now drinks sweet sodas and juices; driks caffeinated beverages       2). Surgeries: PE tubes;  two rods placed 01/18/20; repair of left eyelid ptosis   3). Allergies: No kown medication allergies   4). GYN: On depo-Provera   5). Medications: Nexium, 40 mg.day; levothyroxine 50 mcg/day; olanzapine, 15 mg at bedtime; l-methylfolate, one tablet daily; lamictal 200 mg  C. Pertinent family history:   1). DM: Maternal grandmother   2). Thyroid disease:   3). ASCVD: Maternal grandfather   4). Cancers: Maternal grandmother, paternal grandfather   26). Others: MGM has hypertension an asthma. MGF has hyperlipidemia.    2. Pertinent Review of Systems:  Constitutional: The patient feels well, is healthy, and has no significant complaints. Eyes: Vision is good. There are no significant eye complaints. Neck: The patient has no complaints of anterior neck swelling, soreness, tenderness,  pressure, discomfort, or difficulty swallowing.  Heart: Heart rate increases with exercise or other physical activity. The patient has no complaints of palpitations, irregular heat beats, chest pain, or chest pressure. Gastrointestinal: Bowel movents seem normal. The patient has no complaints of excessive hunger, acid reflux, upset stomach, stomach aches or pains, diarrhea, or constipation. Legs: Muscle mass and strength seem normal. There are no complaints of numbness, tingling, burning, or pain. No edema is noted. Feet: There are no obvious foot problems. There are no complaints of numbness, tingling, burning, or pain. No edema is noted. 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 pylori infection 04/01/2020   Poor fetal growth    Ptosis    PTSD (post-traumatic stress disorder)    Scoliosis    Severe protein-calorie malnutrition Altamease Oiler: less than 60% of standard weight) ([place])     Family History  Problem Relation Age of Onset   Hypertension Maternal Grandmother    Diabetes Maternal Grandmother    Asthma Maternal Grandmother    Cancer Maternal Grandfather        blood   Diabetes Maternal Grandfather    Hypertension Maternal Grandfather    Heart disease Maternal  Grandfather    Hyperlipidemia Maternal Grandfather    Cancer Paternal Grandfather        Lymphoma   Alcohol abuse Neg Hx    Arthritis Neg Hx    Birth defects Neg Hx    COPD Neg Hx    Depression Neg Hx    Drug abuse Neg Hx    Early death Neg Hx    Hearing loss Neg Hx    Kidney disease Neg Hx    Learning disabilities Neg Hx    Mental illness Neg Hx    Mental retardation Neg Hx    Miscarriages / Stillbirths Neg Hx    Stroke Neg Hx    Vision loss Neg Hx    Varicose Veins Neg Hx    Colon cancer Neg Hx    Esophageal cancer Neg Hx    Rectal cancer Neg Hx    Stomach cancer Neg Hx      Current Outpatient Medications:    cephALEXin (KEFLEX) 500 MG capsule, Take 1 capsule (500 mg total) by mouth 2 (two) times daily for 10 days., Disp: 20 capsule, Rfl: 0   esomeprazole (NEXIUM) 40 MG capsule, SPRINKLE CAPSULE CONTENTS INTO APPLESAUCE IF UNABLE TO SWALLOW PILLS., Disp: 90 capsule, Rfl: 1   Ginkgo Biloba 40 MG TABS, Take by mouth., Disp: , Rfl:    ISOtretinoin (ACCUTANE) 30 MG capsule, Take 1 capsule (30 mg total) by mouth 2 (two) times daily., Disp: 60 capsule, Rfl: 0   l-methylfolate-B6-B12 (METANX) 3-35-2 MG TABS tablet, Take 1 tablet by mouth daily., Disp: 30 tablet, Rfl: 3   lamoTRIgine (LAMICTAL) 200 MG tablet, Take 200 mg by mouth daily., Disp: , Rfl:    levothyroxine (SYNTHROID) 50 MCG tablet, Take 50 mcg by mouth daily before breakfast., Disp: , Rfl:    medroxyPROGESTERone (DEPO-PROVERA) 150 MG/ML injection, Inject 150 mg into the muscle every 3 (three) months. , Disp: , Rfl:    OLANZapine (ZYPREXA) 15 MG tablet, Take 15 mg by mouth at bedtime., Disp: , Rfl:   Allergies as of 06/20/2021   (No Known Allergies)    1. Work and Family:Livew with mother and brother. Father is not in the home. Finished 10th grade 2. Activities: *** 3. Smoking, alcohol, or drugs: *** 4. Primary Care Provider: Leveda Anna, NP  REVIEW OF SYSTEMS: There are no other significant problems involving  [name]'s other body systems.   Objective:  Vital Signs:  There were no vitals taken for this visit.   Ht Readings from Last 3 Encounters:  03/09/21 5' (1.524 m) (5 %, Z= -1.68)*  02/10/21 5' (1.524 m) (5 %, Z= -1.68)*  12/21/20 5' 0.5" (1.537 m) (7 %, Z= -1.48)*   * Growth percentiles are based on CDC (Girls, 2-20 Years) data.   Wt Readings from Last 3 Encounters:  06/19/21 143 lb (64.9 kg) (73 %, Z= 0.61)*  03/09/21 145 lb 9.6 oz (66 kg) (  77 %, Z= 0.73)*  02/10/21 144 lb (65.3 kg) (75 %, Z= 0.68)*   * Growth percentiles are based on CDC (Girls, 2-20 Years) data.   HC Readings from Last 3 Encounters:  10/07/20 20.55" (52.2 cm)  05/06/20 20.08" (51 cm)   There is no height or weight on file to calculate BSA.  No height on file for this encounter. No weight on file for this encounter.   PHYSICAL EXAM:  Constitutional: The patient appears healthy and well nourished.  Face: The face appears normal.  Eyes: There is no obvious arcus or proptosis. Moisture appears normal. Mouth: The oropharynx and tongue appear normal. Oral moisture is normal. Neck: The neck appears to be visibly normal. No carotid bruits are noted. The thyroid gland is *** grams in size. The consistency of the thyroid gland is *** normal. The thyroid gland is not tender to palpation. Lungs: The lungs are clear to auscultation. Air movement is good. Heart: Heart rate and rhythm are regular. Heart sounds S1 and S2 are normal. I did not appreciate any pathologic cardiac murmurs. Abdomen: The abdomen appears to be normal in size. Bowel sounds are normal. There is no obvious hepatomegaly, splenomegaly, or other mass effect.  Arms: Muscle size and bulk are normal for age. Hands: There is no obvious tremor. Phalangeal and metacarpophalangeal joints are normal. Palmar muscles are normal. Palmar skin is normal. Palmar moisture is also normal. Legs: Muscles appear normal for age. No edema is present. Feet: Feet are  normally formed. Dorsalis pedal pulses are normal. Neurologic: Strength is normal for age in both the upper and lower extremities. Muscle tone is normal. Sensation to touch is normal in both the legs and feet.   GYN/GU: ***  LAB DATA:  Results for orders placed or performed in visit on 06/18/21 (from the past 504 hour(s))  CBC with Differential/Platelet   Collection Time: 06/18/21 10:58 AM  Result Value Ref Range   WBC 8.1 3.8 - 10.8 Thousand/uL   RBC 5.45 (H) 3.80 - 5.10 Million/uL   Hemoglobin 13.5 11.7 - 15.5 g/dL   HCT 42.3 35.0 - 45.0 %   MCV 77.6 (L) 80.0 - 100.0 fL   MCH 24.8 (L) 27.0 - 33.0 pg   MCHC 31.9 (L) 32.0 - 36.0 g/dL   RDW 14.4 11.0 - 15.0 %   Platelets 281 140 - 400 Thousand/uL   MPV 10.6 7.5 - 12.5 fL   Neutro Abs 4,155 1,500 - 7,800 cells/uL   Lymphs Abs 3,208 850 - 3,900 cells/uL   Absolute Monocytes 527 200 - 950 cells/uL   Eosinophils Absolute 138 15 - 500 cells/uL   Basophils Absolute 73 0 - 200 cells/uL   Neutrophils Relative % 51.3 %   Total Lymphocyte 39.6 %   Monocytes Relative 6.5 %   Eosinophils Relative 1.7 %   Basophils Relative 0.9 %  COMPLETE METABOLIC PANEL WITH GFR   Collection Time: 06/18/21 10:58 AM  Result Value Ref Range   Glucose, Bld 104 (H) 65 - 99 mg/dL   BUN 6 (L) 7 - 20 mg/dL   Creat 0.72 0.50 - 0.96 mg/dL   eGFR 123 > OR = 60 mL/min/1.76m   BUN/Creatinine Ratio 8 6 - 22 (calc)   Sodium 137 135 - 146 mmol/L   Potassium 4.1 3.8 - 5.1 mmol/L   Chloride 106 98 - 110 mmol/L   CO2 21 20 - 32 mmol/L   Calcium 9.7 8.9 - 10.4 mg/dL   Total Protein 7.1  6.3 - 8.2 g/dL   Albumin 4.1 3.6 - 5.1 g/dL   Globulin 3.0 2.0 - 3.8 g/dL (calc)   AG Ratio 1.4 1.0 - 2.5 (calc)   Total Bilirubin 0.4 0.2 - 1.1 mg/dL   Alkaline phosphatase (APISO) 121 36 - 128 U/L   AST 34 (H) 12 - 32 U/L   ALT 32 5 - 32 U/L  TSH   Collection Time: 06/18/21 10:58 AM  Result Value Ref Range   TSH 1.69 mIU/L  T4, free   Collection Time: 06/18/21 10:58 AM   Result Value Ref Range   Free T4 1.2 0.8 - 1.4 ng/dL  T3, free   Collection Time: 06/18/21 10:58 AM  Result Value Ref Range   T3, Free 3.8 3.0 - 4.7 pg/mL  Results for orders placed or performed in visit on 06/17/21 (from the past 504 hour(s))  POCT Glucose (CBG)   Collection Time: 06/17/21  4:41 PM  Result Value Ref Range   POC Glucose 239 (A) 70 - 99 mg/dl  POCT Urinalysis Dipstick   Collection Time: 06/17/21  4:42 PM  Result Value Ref Range   Color, UA yellow    Clarity, UA clear    Glucose, UA Positive (A) Negative   Bilirubin, UA neg    Ketones, UA neg    Spec Grav, UA 1.020 1.010 - 1.025   Blood, UA trace    pH, UA 6.0 5.0 - 8.0   Protein, UA Negative Negative   Urobilinogen, UA 0.2 0.2 or 1.0 E.U./dL   Nitrite, UA neg    Leukocytes, UA Negative Negative   Appearance     Odor    Results for orders placed or performed in visit on 06/14/21 (from the past 504 hour(s))  POCT urine pregnancy   Collection Time: 06/14/21  9:16 AM  Result Value Ref Range   Preg Test, Ur Negative Negative   Lab 06/17/21: HbA1c pending; TSH 1.69, free T4 1.2, free T3 3.8; CMP normal, except glucose 104 and  AST 34 (ref 12-32); CBC abnormal, with RBC 5.45 (ref 3.80-5.10), MCV 77.6 (ref 80-100), MCG 24.8 (ref 27-33), MCHC 31.9 (ref 32-36); Urinalysis: glucose positive, ketones negative   Lab 06/14/21: Urine pregnancy test negative   Assessment and Plan:   ASSESSMENT:  1. *** 2. *** 3. *** 4. *** 5. ***  PLAN:  1. Diagnostic: *** 2. Therapeutic: *** 3. Patient education: *** 4. Follow-up: No follow-ups on file.  Level of Service: This visit lasted in excess of 40 minutes. More than 50% of the visit was devoted to counseling.  Tillman Sers, MD, CDE Adult and Pediatric Endocrinology

## 2021-06-19 NOTE — Progress Notes (Signed)
Subjective:    [name] is a 20 y.o. female  With : Eating disorder Hypothyroidism Major depression  Excessive weight gain who complains of dysuria and foul smelling urine for a few days.  Patient also complains of stomach ache. Patient denies back pain, congestion, and fever.  Patient does not have a history of recurrent UTI.  Patient does not have a history of pyelonephritis. The following portions of the patient's history were reviewed and updated as appropriate: allergies, current medications, past family history, past medical history, past social history, past surgical history, and problem list. Review of Systems Pertinent items are noted in HPI.    Objective:    Wt 143 lb (64.9 kg)   BMI 27.93 kg/m  General: alert, cooperative, and moderately obese  Abdomen: soft, non-tender, without masses or organomegaly in the periumbilical area  Back: CVA tenderness absent  GU: defer exam  HEENT --normal  Chest --clear CVS--no murmurs Skin -normal   Laboratory:  Urine dipstick shows sp gravity 1010, 4+ for glucose, 2+ for hemoglobin, negative for ketones, 2+ for leukocyte esterase, negative for nitrites, trace for protein, and trace for urobilinogen.   Micro exam: not done.    Assessment:    UTI and Prediabetes     Plan:    1. Medications:  keflex  2. Maintain adequate hydration 3. Follow up if symptoms not improving, and prn.   BLOOD GLUCOSE --269--discussed with dr Quincy Sheehan and Dr Fawn Kirk ned for admission --labs CBC, CMP, Hb A1C, TSH --free T4 and Free T3. TO see Dr Fransico Michael early next week.

## 2021-06-19 NOTE — Patient Instructions (Signed)
Prediabetes Eating Plan °Prediabetes is a condition that causes blood sugar (glucose) levels to be higher than normal. This increases the risk for developing type 2 diabetes (type 2 diabetes mellitus). Working with a health care provider or nutrition specialist (dietitian) to make diet and lifestyle changes can help prevent the onset of diabetes. These changes may help you: °Control your blood glucose levels. °Improve your cholesterol levels. °Manage your blood pressure. °What are tips for following this plan? °Reading food labels °Read food labels to check the amount of fat, salt (sodium), and sugar in prepackaged foods. Avoid foods that have: °Saturated fats. °Trans fats. °Added sugars. °Avoid foods that have more than 300 milligrams (mg) of sodium per serving. Limit your sodium intake to less than 2,300 mg each day. °Shopping °Avoid buying pre-made and processed foods. °Avoid buying drinks with added sugar. °Cooking °Cook with olive oil. Do not use butter, lard, or ghee. °Bake, broil, grill, steam, or boil foods. Avoid frying. °Meal planning ° °Work with your dietitian to create an eating plan that is right for you. This may include tracking how many calories you take in each day. Use a food diary, notebook, or mobile application to track what you eat at each meal. °Consider following a Mediterranean diet. This includes: °Eating several servings of fresh fruits and vegetables each day. °Eating fish at least twice a week. °Eating one serving each day of whole grains, beans, nuts, and seeds. °Using olive oil instead of other fats. °Limiting alcohol. °Limiting red meat. °Using nonfat or low-fat dairy products. °Consider following a plant-based diet. This includes dietary choices that focus on eating mostly vegetables and fruit, grains, beans, nuts, and seeds. °If you have high blood pressure, you may need to limit your sodium intake or follow a diet such as the DASH (Dietary Approaches to Stop Hypertension) eating  plan. The DASH diet aims to lower high blood pressure. °Lifestyle °Set weight loss goals with help from your health care team. It is recommended that most people with prediabetes lose 7% of their body weight. °Exercise for at least 30 minutes 5 or more days a week. °Attend a support group or seek support from a mental health counselor. °Take over-the-counter and prescription medicines only as told by your health care provider. °What foods are recommended? °Fruits °Berries. Bananas. Apples. Oranges. Grapes. Papaya. Mango. Pomegranate. Kiwi. Grapefruit. Cherries. °Vegetables °Lettuce. Spinach. Peas. Beets. Cauliflower. Cabbage. Broccoli. Carrots. Tomatoes. Squash. Eggplant. Herbs. Peppers. Onions. Cucumbers. Brussels sprouts. °Grains °Whole grains, such as whole-wheat or whole-grain breads, crackers, cereals, and pasta. Unsweetened oatmeal. Bulgur. Barley. Quinoa. Brown rice. Corn or whole-wheat flour tortillas or taco shells. °Meats and other proteins °Seafood. Poultry without skin. Lean cuts of pork and beef. Tofu. Eggs. Nuts. Beans. °Dairy °Low-fat or fat-free dairy products, such as yogurt, cottage cheese, and cheese. °Beverages °Water. Tea. Coffee. Sugar-free or diet soda. Seltzer water. Low-fat or nonfat milk. Milk alternatives, such as soy or almond milk. °Fats and oils °Olive oil. Canola oil. Sunflower oil. Grapeseed oil. Avocado. Walnuts. °Sweets and desserts °Sugar-free or low-fat pudding. Sugar-free or low-fat ice cream and other frozen treats. °Seasonings and condiments °Herbs. Sodium-free spices. Mustard. Relish. Low-salt, low-sugar ketchup. Low-salt, low-sugar barbecue sauce. Low-fat or fat-free mayonnaise. °The items listed above may not be a complete list of recommended foods and beverages. Contact a dietitian for more information. °What foods are not recommended? °Fruits °Fruits canned with syrup. °Vegetables °Canned vegetables. Frozen vegetables with butter or cream sauce. °Grains °Refined white  flour and flour   products, such as bread, pasta, snack foods, and cereals. °Meats and other proteins °Fatty cuts of meat. Poultry with skin. Breaded or fried meat. Processed meats. °Dairy °Full-fat yogurt, cheese, or milk. °Beverages °Sweetened drinks, such as iced tea and soda. °Fats and oils °Butter. Lard. Ghee. °Sweets and desserts °Baked goods, such as cake, cupcakes, pastries, cookies, and cheesecake. °Seasonings and condiments °Spice mixes with added salt. Ketchup. Barbecue sauce. Mayonnaise. °The items listed above may not be a complete list of foods and beverages that are not recommended. Contact a dietitian for more information. °Where to find more information °American Diabetes Association: www.diabetes.org °Summary °You may need to make diet and lifestyle changes to help prevent the onset of diabetes. These changes can help you control blood sugar, improve cholesterol levels, and manage blood pressure. °Set weight loss goals with help from your health care team. It is recommended that most people with prediabetes lose 7% of their body weight. °Consider following a Mediterranean diet. This includes eating plenty of fresh fruits and vegetables, whole grains, beans, nuts, seeds, fish, and low-fat dairy, and using olive oil instead of other fats. °This information is not intended to replace advice given to you by your health care provider. Make sure you discuss any questions you have with your health care provider. °Document Revised: 10/16/2019 Document Reviewed: 10/16/2019 °[employer]vier Patient Education © 2022 [employer] ° °

## 2021-06-20 ENCOUNTER — Telehealth: Payer: Self-pay

## 2021-06-20 ENCOUNTER — Other Ambulatory Visit: Payer: Self-pay | Admitting: Pediatrics

## 2021-06-20 ENCOUNTER — Ambulatory Visit (INDEPENDENT_AMBULATORY_CARE_PROVIDER_SITE_OTHER): Payer: Medicaid Other | Admitting: "Endocrinology

## 2021-06-20 DIAGNOSIS — L700 Acne vulgaris: Secondary | ICD-10-CM

## 2021-06-20 LAB — CBC WITH DIFFERENTIAL/PLATELET
Absolute Monocytes: 527 cells/uL (ref 200–950)
Basophils Absolute: 73 cells/uL (ref 0–200)
Basophils Relative: 0.9 %
Eosinophils Absolute: 138 cells/uL (ref 15–500)
Eosinophils Relative: 1.7 %
HCT: 42.3 % (ref 35.0–45.0)
Hemoglobin: 13.5 g/dL (ref 11.7–15.5)
Lymphs Abs: 3208 cells/uL (ref 850–3900)
MCH: 24.8 pg — ABNORMAL LOW (ref 27.0–33.0)
MCHC: 31.9 g/dL — ABNORMAL LOW (ref 32.0–36.0)
MCV: 77.6 fL — ABNORMAL LOW (ref 80.0–100.0)
MPV: 10.6 fL (ref 7.5–12.5)
Monocytes Relative: 6.5 %
Neutro Abs: 4155 cells/uL (ref 1500–7800)
Neutrophils Relative %: 51.3 %
Platelets: 281 10*3/uL (ref 140–400)
RBC: 5.45 10*6/uL — ABNORMAL HIGH (ref 3.80–5.10)
RDW: 14.4 % (ref 11.0–15.0)
Total Lymphocyte: 39.6 %
WBC: 8.1 10*3/uL (ref 3.8–10.8)

## 2021-06-20 LAB — COMPLETE METABOLIC PANEL WITH GFR
AG Ratio: 1.4 (calc) (ref 1.0–2.5)
ALT: 32 U/L (ref 5–32)
AST: 34 U/L — ABNORMAL HIGH (ref 12–32)
Albumin: 4.1 g/dL (ref 3.6–5.1)
Alkaline phosphatase (APISO): 121 U/L (ref 36–128)
BUN/Creatinine Ratio: 8 (calc) (ref 6–22)
BUN: 6 mg/dL — ABNORMAL LOW (ref 7–20)
CO2: 21 mmol/L (ref 20–32)
Calcium: 9.7 mg/dL (ref 8.9–10.4)
Chloride: 106 mmol/L (ref 98–110)
Creat: 0.72 mg/dL (ref 0.50–0.96)
Globulin: 3 g/dL (calc) (ref 2.0–3.8)
Glucose, Bld: 104 mg/dL — ABNORMAL HIGH (ref 65–99)
Potassium: 4.1 mmol/L (ref 3.8–5.1)
Sodium: 137 mmol/L (ref 135–146)
Total Bilirubin: 0.4 mg/dL (ref 0.2–1.1)
Total Protein: 7.1 g/dL (ref 6.3–8.2)
eGFR: 123 mL/min/{1.73_m2} (ref >=60)

## 2021-06-20 LAB — T4, FREE: Free T4: 1.2 ng/dL (ref 0.8–1.4)

## 2021-06-20 LAB — TSH: TSH: 1.69 mIU/L

## 2021-06-20 LAB — URINE CULTURE
MICRO NUMBER:: 12661915
Result:: NO GROWTH
SPECIMEN QUALITY:: ADEQUATE

## 2021-06-20 LAB — HEMOGLOBIN A1C
Hgb A1c MFr Bld: 7.1 % of total Hgb — ABNORMAL HIGH (ref <5.7)
Mean Plasma Glucose: 157 mg/dL
eAG (mmol/L): 8.7 mmol/L

## 2021-06-20 LAB — T3, FREE: T3, Free: 3.8 pg/mL (ref 3.0–4.7)

## 2021-06-20 NOTE — Telephone Encounter (Signed)
She is all done based on weight based dosing!

## 2021-06-20 NOTE — Telephone Encounter (Signed)
Parent called requesting refill of Accutane. At appointment last week RN spoke with parent and told her that she has completed accutane therapy. Rec'd refill request today asking for refill.

## 2021-06-22 ENCOUNTER — Telehealth (INDEPENDENT_AMBULATORY_CARE_PROVIDER_SITE_OTHER): Payer: Self-pay | Admitting: "Endocrinology

## 2021-06-22 ENCOUNTER — Other Ambulatory Visit: Payer: Self-pay

## 2021-06-22 ENCOUNTER — Other Ambulatory Visit (INDEPENDENT_AMBULATORY_CARE_PROVIDER_SITE_OTHER): Payer: Self-pay | Admitting: "Endocrinology

## 2021-06-22 ENCOUNTER — Encounter (INDEPENDENT_AMBULATORY_CARE_PROVIDER_SITE_OTHER): Payer: Self-pay | Admitting: "Endocrinology

## 2021-06-22 ENCOUNTER — Ambulatory Visit (INDEPENDENT_AMBULATORY_CARE_PROVIDER_SITE_OTHER): Payer: Medicaid Other | Admitting: "Endocrinology

## 2021-06-22 VITALS — BP 122/76 | HR 80 | Ht 60.47 in | Wt 149.4 lb

## 2021-06-22 DIAGNOSIS — E663 Overweight: Secondary | ICD-10-CM

## 2021-06-22 DIAGNOSIS — R718 Other abnormality of red blood cells: Secondary | ICD-10-CM

## 2021-06-22 DIAGNOSIS — R7303 Prediabetes: Secondary | ICD-10-CM

## 2021-06-22 DIAGNOSIS — R7401 Elevation of levels of liver transaminase levels: Secondary | ICD-10-CM

## 2021-06-22 DIAGNOSIS — Z8349 Family history of other endocrine, nutritional and metabolic diseases: Secondary | ICD-10-CM

## 2021-06-22 DIAGNOSIS — E119 Type 2 diabetes mellitus without complications: Secondary | ICD-10-CM

## 2021-06-22 DIAGNOSIS — E039 Hypothyroidism, unspecified: Secondary | ICD-10-CM | POA: Diagnosis not present

## 2021-06-22 LAB — POCT GLYCOSYLATED HEMOGLOBIN (HGB A1C): Hemoglobin A1C: 6.8 % — AB (ref 4.0–5.6)

## 2021-06-22 LAB — POCT GLUCOSE (DEVICE FOR HOME USE): Glucose Fasting, POC: 126 mg/dL — AB (ref 70–99)

## 2021-06-22 MED ORDER — OMEPRAZOLE 20 MG PO CPDR: 20.0000 mg | DELAYED_RELEASE_CAPSULE | Freq: Every day | ORAL | 5 refills | Status: DC

## 2021-06-22 NOTE — Telephone Encounter (Signed)
  Who's calling (name and relationship to patient) :mom/ Leandra Kern contact number:[phone]  Provider they see:Dr. Fransico Michael   Reason for call:Pharmacy stated that they need the dosage amount as well as how and when to take it. Patient stated that she needs it as soon as possible to leave out of town for a week. Please advise      PRESCRIPTION REFILL ONLY  Name of prescription:Omeprazole   Pharmacy:[facility] [address], [place]

## 2021-06-22 NOTE — Patient Instructions (Signed)
Follow up visit in 3 months.   At [facility], we are committed to providing exceptional care. You will receive a patient satisfaction survey through text or email regarding your visit today. Your opinion is important to me. Comments are appreciated.   

## 2021-06-22 NOTE — Telephone Encounter (Signed)
Spoke with pharmacy. Clarified to take 20 mg bid. Called to notify mom that Rx has been updated correctly

## 2021-06-22 NOTE — Progress Notes (Addendum)
Subjective:5   Patient Name: [name] Date of Birth: 2001-06-13  MRN: [id number]  [name]  presents to the office today, in referral from Dr. Laurice Record, for initial endocrine consultation for the chief complaint of her new-onset DM and rapid weight gain.  HISTORY OF PRESENT ILLNESS:   [name] is a 20 y.o. Caucasian young woman.  [name] was accompanied by her mother.   1. [name] had her initial pediatric endocrine consultation on 06/22/21:  A. [name] presents today in referral from Dr. Laurice Record for evaluation and treatment of rapid weight gain and elevated glucose.    1). Present illness: A). Height was at the 4.85% at age 81, increased to the 9.59% at age 20, and has plateaued at the 4.67%.    B). Weight was below the 3% through age 22, increased to the 2.85% at age 89, decreased to the <0.01% from age [age], and had since then increased dramatically to the 73% at age 21.     C). BMI curve followed the weight curve. Her most recent BMI was at the 90.97%.    D). [name] was diagnosed with major depressive disorder at about age [age] in freshman year in high school. She lost a large amount of weight at that time. She is followed at [facility]. Gustavo started [employer] in about 2019-2020. She was in [facility] from August 2019 to October 2020 for treatment for eating disorder and depression. In November 2020 her weight had increased to the 6.62%. [name] started Zyprexa shortly after her discharge from [facility], probably in early 2021.      E). [name] was then followed at the [facility]. Unfortunately, [name]'s weight later decreased to the <0.01% in October 2021.     F). [name] was diagnosed with h. Pylorii gastritis in November 2021 and was treated with antibiotics. Thereafter her appetite improved and she started gaining weight.  Her weight then increased 52 pounds in the following year.     G). In retrospect, [name] had also been  eating a lot of carbs and desserts and drinking a lot of regular sodas, to include Dr. Malachi Bonds, and sweet juices.     H). On 06/17/21 [name] was seen by Dr. Laurice Record for evaluation and treatment of dysuria. Urinalysis showed 4+ glucose, 2+ hemoglobin, 2+ leukocyte esterase, negative for ketones, negative nor nitrites, and trace for protein and urobilinogen. CBG was 269. She was scheduled to see me on 06/20/21. Unfortunately, that appointment was made in error and was changed to 06/22/21.   B. Pertinent past medical history:   1). Medical problems:       A). Idiopathic thoracolumbar scoliosis    B). Hypothyroidism, primary, acquired: Diagnosed in [place]). Acne, treated with Accutane    D). Genetic defect: paternally derived FLG abnormality, other VUS     E). Major depressive disorder: As noted above    F). Suspected autism disorder    G). Selective mutism as an adjustment disorder    H). Anorexia, self-imposed food restriction, severe protein-calorie malnutrition    I). ADHD, anxiety, PTSD (Mother does not know why.), hallucinations, intellectual disability    J). Tachycardia:     K). Activities: Sedentary, except for walking her dog      2). Surgeries: PE tubes; Scoliosis surgery 2018, two rods placed 01/18/20; repair of left eyelid ptosis   3). Allergies: No known medication allergies   4). GYN: Menarche occurred at age 50-15. Menses have always been  irregular. She has been on depo-Provera since about 2021.   5). Medications: Nexium, 40 mg.day; levothyroxine 50 mcg/day; olanzapine, 15 mg at bedtime; l-methylfolate, one tablet daily; lamictal 200 mg  C. Pertinent family history:   1). Obesity: Mother, maternal grandmother, brother   2). DM: Maternal grandmother, mother, and maternal aunt have T2DM.    2). Thyroid disease: Maternal grandmother and maternal aunt have underactive thyroid glands. Both ladies had Graves' disease.   3). ASCVD: Maternal grandfather had a stroke. Maternal aunt  may have had a stroke.    4). Cancers: Paternal grandfather   67). Others: Maternal grandmother has hypertension and asthma. Maternal grandfather has hyperlipidemia. Dad is an alcoholic. Paternal grandfather had bad arthritis with gnarly hands. Brother has active carpal tunnel disease.   2. Pertinent Review of Systems:  Constitutional: The patient feels "good."  She does not have any significant complaints. Eyes: Vision is fairly good. She has glasses, but won't wear them. There are no significant eye complaints. Neck: The patient has no complaints of anterior neck swelling, soreness, tenderness,  pressure, discomfort, or difficulty swallowing.  Heart: Heart rate increases with exercise or other physical activity. The patient has no complaints of palpitations, irregular heat beats, chest pain, or chest pressure. Gastrointestinal: She has a lot of belly hunger. She had been on Nexium, but ran out a month ago. Bowel movents seem normal. The patient has no complaints of excessive hunger, acid reflux, upset stomach, stomach aches or pains, diarrhea, or constipation. Hands: She sometimes has pains in her palms and wrists. Legs: Muscle mass and strength seem normal. There are no complaints of numbness, tingling, burning, or pain. No edema is noted. Feet: There are no obvious foot problems. There are no complaints of numbness, tingling, burning, or pain. No edema is noted. G[facility]: [facility]    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 pylori infection 04/01/2020   Poor fetal growth    Ptosis    PTSD (post-traumatic stress disorder)    Scoliosis    Severe protein-calorie malnutrition [employer]: less than 60% of standard weight) ([place])     Family History  Problem Relation Age of Onset   Hypertension Maternal Grandmother    Diabetes Maternal Grandmother    Asthma Maternal Grandmother    Cancer Maternal Grandfather        blood   Diabetes Maternal Grandfather    Hypertension Maternal Grandfather    Heart disease Maternal Grandfather    Hyperlipidemia Maternal Grandfather    Cancer Paternal Grandfather        Lymphoma   Alcohol abuse Neg Hx    Arthritis Neg Hx    Birth defects Neg Hx    COPD Neg Hx    Depression Neg Hx    Drug abuse Neg Hx    Early death Neg Hx    Hearing loss Neg Hx    Kidney disease Neg Hx    Learning disabilities Neg Hx  Mental illness Neg Hx    Mental retardation Neg Hx    Miscarriages / Stillbirths Neg Hx    Stroke Neg Hx    Vision loss Neg Hx    Varicose Veins Neg Hx    Colon cancer Neg Hx    Esophageal cancer Neg Hx    Rectal cancer Neg Hx    Stomach cancer Neg Hx      Current Outpatient Medications:    cephALEXin (KEFLEX) 500 MG capsule, Take 1 capsule (500 mg total) by mouth 2 (two) times daily for 10 days., Disp: 20 capsule, Rfl: 0   esomeprazole (NEXIUM) 40 MG capsule, SPRINKLE CAPSULE CONTENTS INTO APPLESAUCE IF UNABLE TO SWALLOW PILLS., Disp: 90 capsule, Rfl: 1   ISOtretinoin (ACCUTANE) 30 MG capsule, Take 1 capsule (30 mg total) by mouth 2 (two) times daily., Disp: 60 capsule, Rfl: 0   l-methylfolate-B6-B12 (METANX) 3-35-2 MG TABS tablet, Take 1 tablet by mouth daily., Disp: 30 tablet, Rfl: 3   lamoTRIgine (LAMICTAL) 200 MG tablet, Take 200 mg by mouth daily., Disp: , Rfl:    levothyroxine (SYNTHROID) 50 MCG tablet, Take 50 mcg by mouth daily before breakfast., Disp: , Rfl:    medroxyPROGESTERone (DEPO-PROVERA) 150 MG/ML injection, Inject 150 mg into the  muscle every 3 (three) months. , Disp: , Rfl:    OLANZapine (ZYPREXA) 10 MG tablet, Take 10 mg by mouth at bedtime., Disp: , Rfl:    Ginkgo Biloba 40 MG TABS, Take by mouth. (Patient not taking: Reported on 06/22/2021), Disp: , Rfl:   Allergies as of 06/22/2021   (No Known Allergies)    1. Work and Family: Lives with mother and brother. Father is not in the home. Finished 10th grade. She goes to a day school program from 10:00 AM to 3:00 PM.  2. Activities: Sedentary, stays at home for the most part.  3. Smoking, alcohol, or drugs: None... 4. Primary Care Provider: Leveda Anna, NP  REVIEW OF SYSTEMS: There are no other significant problems involving Athenia's other body systems.   Objective:  Vital Signs:  BP 122/76   Pulse 80   Ht 5' 0.47" (1.536 m)   Wt 149 lb 6.4 oz (67.8 kg)   BMI 28.72 kg/m    Ht Readings from Last 3 Encounters:  06/22/21 5' 0.47" (1.536 m) (7 %, Z= -1.50)*  03/09/21 5' (1.524 m) (5 %, Z= -1.68)*  02/10/21 5' (1.524 m) (5 %, Z= -1.68)*   * Growth percentiles are based on CDC (Girls, 2-20 Years) data.   Wt Readings from Last 3 Encounters:  06/22/21 149 lb 6.4 oz (67.8 kg) (80 %, Z= 0.83)*  06/19/21 143 lb (64.9 kg) (73 %, Z= 0.61)*  03/09/21 145 lb 9.6 oz (66 kg) (77 %, Z= 0.73)*   * Growth percentiles are based on CDC (Girls, 2-20 Years) data.   HC Readings from Last 3 Encounters:  10/07/20 20.55" (52.2 cm)  05/06/20 20.08" (51 cm)   Body surface area is 1.7 meters squared.  7 %ile (Z= -1.50) based on CDC (Girls, 2-20 Years) Stature-for-age data based on Stature recorded on 06/22/2021. 80 %ile (Z= 0.83) based on CDC (Girls, 2-20 Years) weight-for-age data using vitals from 06/22/2021.   PHYSICAL EXAM:  Constitutional: The patient appears healthy and overweight. Her height is at the 6.72%. Her weight is at the 79.57%. Her BMI is at the 91.23%. She sat passively in her chair with her hands folded. She answered my questions with one-word answers.  Her  affect was flat. Her insight seemed limited.  Face: The face appears normal.  Eyes: There is no obvious arcus or proptosis. Moisture appears normal. Mouth: The oropharynx and tongue appear normal. Oral moisture is normal. Neck: The neck appears to be visibly normal. No carotid bruits are noted. The thyroid gland is top-normal in size at about 20 grams in size. The consistency of the thyroid gland is normal. The thyroid gland is not tender to palpation. Lungs: The lungs are clear to auscultation. Air movement is good. Heart: Heart rate and rhythm are regular. Heart sounds S1 and S2 are normal. I did not appreciate any pathologic cardiac murmurs. Abdomen: The abdomen appears to be overweight/obese. Bowel sounds are normal. There is no obvious hepatomegaly, splenomegaly, or other mass effect.  Arms: Muscle size and bulk are normal for age. Hands: There is a 1-2+ gross hand tremor. . Phalangeal and metacarpophalangeal joints are normal. Palmar muscles are normal. Palmar skin is normal. Palmar moisture is also normal. Legs: Muscles appear normal for age. No edema is present. Feet: Feet are normally formed. Dorsalis pedal pulses are normal 1+. Neurologic: Strength is normal for age in both the upper and lower extremities. Muscle tone is normal. Sensation to touch is normal in both the legs and feet.    LAB DATA:  Results for orders placed or performed in visit on 06/22/21 (from the past 504 hour(s))  POCT Glucose (Device for Home Use)   Collection Time: 06/22/21  8:06 AM  Result Value Ref Range   Glucose Fasting, POC 126 (A) 70 - 99 mg/dL   POC Glucose    POCT glycosylated hemoglobin (Hb A1C)   Collection Time: 06/22/21  8:35 AM  Result Value Ref Range   Hemoglobin A1C 6.8 (A) 4.0 - 5.6 %   HbA1c POC (<> result, manual entry)     HbA1c, POC (prediabetic range)     HbA1c, POC (controlled diabetic range)    Results for orders placed or performed in visit on 06/18/21 (from the past 504  hour(s))  CBC with Differential/Platelet   Collection Time: 06/18/21 10:58 AM  Result Value Ref Range   WBC 8.1 3.8 - 10.8 Thousand/uL   RBC 5.45 (H) 3.80 - 5.10 Million/uL   Hemoglobin 13.5 11.7 - 15.5 g/dL   HCT 42.3 35.0 - 45.0 %   MCV 77.6 (L) 80.0 - 100.0 fL   MCH 24.8 (L) 27.0 - 33.0 pg   MCHC 31.9 (L) 32.0 - 36.0 g/dL   RDW 14.4 11.0 - 15.0 %   Platelets 281 140 - 400 Thousand/uL   MPV 10.6 7.5 - 12.5 fL   Neutro Abs 4,155 1,500 - 7,800 cells/uL   Lymphs Abs 3,208 850 - 3,900 cells/uL   Absolute Monocytes 527 200 - 950 cells/uL   Eosinophils Absolute 138 15 - 500 cells/uL   Basophils Absolute 73 0 - 200 cells/uL   Neutrophils Relative % 51.3 %   Total Lymphocyte 39.6 %   Monocytes Relative 6.5 %   Eosinophils Relative 1.7 %   Basophils Relative 0.9 %  COMPLETE METABOLIC PANEL WITH GFR   Collection Time: 06/18/21 10:58 AM  Result Value Ref Range   Glucose, Bld 104 (H) 65 - 99 mg/dL   BUN 6 (L) 7 - 20 mg/dL   Creat 0.72 0.50 - 0.96 mg/dL   eGFR 123 > OR = 60 mL/min/1.23m   BUN/Creatinine Ratio 8 6 - 22 (calc)   Sodium 137 135 - 146 mmol/L   Potassium  4.1 3.8 - 5.1 mmol/L   Chloride 106 98 - 110 mmol/L   CO2 21 20 - 32 mmol/L   Calcium 9.7 8.9 - 10.4 mg/dL   Total Protein 7.1 6.3 - 8.2 g/dL   Albumin 4.1 3.6 - 5.1 g/dL   Globulin 3.0 2.0 - 3.8 g/dL (calc)   AG Ratio 1.4 1.0 - 2.5 (calc)   Total Bilirubin 0.4 0.2 - 1.1 mg/dL   Alkaline phosphatase (APISO) 121 36 - 128 U/L   AST 34 (H) 12 - 32 U/L   ALT 32 5 - 32 U/L  TSH   Collection Time: 06/18/21 10:58 AM  Result Value Ref Range   TSH 1.69 mIU/L  T4, free   Collection Time: 06/18/21 10:58 AM  Result Value Ref Range   Free T4 1.2 0.8 - 1.4 ng/dL  T3, free   Collection Time: 06/18/21 10:58 AM  Result Value Ref Range   T3, Free 3.8 3.0 - 4.7 pg/mL  Hemoglobin A1c   Collection Time: 06/18/21 10:58 AM  Result Value Ref Range   Hgb A1c MFr Bld 7.1 (H) <5.7 % of total Hgb   Mean Plasma Glucose 157 mg/dL    eAG (mmol/L) 8.7 mmol/L  Results for orders placed or performed in visit on 06/17/21 (from the past 504 hour(s))  Urine Culture   Collection Time: 06/17/21  4:40 PM   Specimen: Urine  Result Value Ref Range   MICRO NUMBER: [id number]    SPECIMEN QUALITY: Adequate    Sample Source URINE    STATUS: FINAL    Result: No Growth   POCT Glucose (CBG)   Collection Time: 06/17/21  4:41 PM  Result Value Ref Range   POC Glucose 239 (A) 70 - 99 mg/dl  POCT Urinalysis Dipstick   Collection Time: 06/17/21  4:42 PM  Result Value Ref Range   Color, UA yellow    Clarity, UA clear    Glucose, UA Positive (A) Negative   Bilirubin, UA neg    Ketones, UA neg    Spec Grav, UA 1.020 1.010 - 1.025   Blood, UA trace    pH, UA 6.0 5.0 - 8.0   Protein, UA Negative Negative   Urobilinogen, UA 0.2 0.2 or 1.0 E.U./dL   Nitrite, UA neg    Leukocytes, UA Negative Negative   Appearance     Odor    Results for orders placed or performed in visit on 06/14/21 (from the past 504 hour(s))  POCT urine pregnancy   Collection Time: 06/14/21  9:16 AM  Result Value Ref Range   Preg Test, Ur Negative Negative   Labs 06/22/21: HbA1c 6.8%, CBG 126; C-peptide 7.38 (ref 0.80-3.85), TPO antibody 3 (ref <9), thyroglobulin antibody 3 (ref < or = 1)  Lab 06/17/21: HbA1c 7.1%; TSH 1.69, free T4 1.2, free T3 3.8; CMP normal, except glucose 104 and  AST 34 (ref 12-32); CBC abnormal, with RBC 5.45 (ref 3.80-5.10), MCV 77.6 (ref 80-100), MC h 24.8 (ref 27-33), MCHC 31.9 (ref 32-36); Urinalysis: glucose positive, ketones negative   Lab 06/14/21: Urine pregnancy test negative   Assessment and Plan:   ASSESSMENT:  1. New-onset DM, probably T2DM:  A. Salley has the personal history of being overweight and having new-onset DM. She also has the family history of obesity and T2DM.   B. Since her dyspepsia is stimulating her appetite, she is a good candidate for treatment with omeprazole, with our Eat Right Diet plan, and with  increasing her physical  activity. We will consider metformin and GLP-1 agents in the future.   2. Overweight,with rapid weight gain: The patient's overly fat adipose cells produce excessive amount of cytokines that both directly and indirectly cause serious health problems.   A. Some cytokines cause hypertension. Other cytokines cause inflammation within arterial walls. Still other cytokines contribute to dyslipidemia. Yet other cytokines cause resistance to insulin and compensatory hyperinsulinemia.  B. The hyperinsulinemia, in turn, causes acquired acanthosis nigricans and  excess gastric acid production resulting in dyspepsia (excess belly hunger, upset stomach, and often stomach pains).   C. Hyperinsulinemia in children causes more rapid linear growth than usual. The combination of tall child and heavy body stimulates the onset of central precocity in ways that we still do not understand. The final adult height is often much reduced.  D. Hyperinsulinemia in women also stimulates excess production of testosterone by the ovaries and both androstenedione and DHEA by the adrenal glands, resulting in hirsutism, irregular menses, secondary amenorrhea, and infertility. This symptom complex is commonly called Polycystic Ovarian Syndrome, but many endocrinologists still prefer the diagnostic label of the Stein-leventhal Syndrome.  E. When the insulin resistance overwhelms the ability of the pancreatic beta cells to produce ever increasing amounts of insulin, glucose intolerance ensues. Initially the patients develop pre-diabetes. Unfortunately, unless the patient make the lifestyle changes that are needed to lose fat weight, they will usually progress to frank T2DM. Samariah has done so.  Georgina Pillion has a C-peptide value that is about twice the upper limit of the normal range, but was not enough insulin to control her BGs at her previous level of sugar intake. If she Eats Right and exercises she can probably attain  normal BGs without needing any oral medication. However, if she does not make the lifestyle changes she needs to, we will start metformin.  3. Hypothyroid, acquired, probably autoimmune:   A. Aseel has a family history of what sounds like autoimmune hyperthyroidism and hypothyroidism.  B. Since Jodee did not have any thyroid surgery or irradiation herself, and since she was never placed on a prolonged low-iodine diet, it is very likely that Hashimoto's disease was the cause of her acquired hypothyroidism.   C.Her TFTs in November 2022 were euthyroid on her current levothyroxine dose of 50 mcg/day.   D. Her elevated thyroglobulin confirms the clinical suspicion of Hashimoto's disease. 4. Elevated transaminase: This problem may be due to her rapid rise in fat weight.  5. Abnormal RBC indices: I don not yet understand the cause of these abnormalities. Her indices were normal in 2019-2021.  6. Dyspepsia: As above. She is a good candidate for omeprazole.  6.Family history thyroid disease: As above  PLAN:  1. Diagnostic: C-peptide, TPO antibody, thyroglobulin antibody today 2. Therapeutic: Start omeprazole 20 mg, twice a day. Eat Right Diet. Walk for an hour a day. Consider adding metformin.  3. Patient education: We discussed all of the above at great length.  4. Follow-up: 3 months  Level of Service: This visit lasted in excess of 160 minutes. More than 50% of the visit was devoted to counseling.  Tillman Sers, MD, CDE Adult and Pediatric Endocrinology

## 2021-06-24 LAB — THYROGLOBULIN ANTIBODY: Thyroglobulin Ab: 3 IU/mL — ABNORMAL HIGH (ref <=1)

## 2021-06-24 LAB — THYROID PEROXIDASE ANTIBODY: Thyroperoxidase Ab SerPl-aCnc: 3 IU/mL (ref <9)

## 2021-06-24 LAB — C-PEPTIDE: C-Peptide: 7.38 ng/mL — ABNORMAL HIGH (ref 0.80–3.85)

## 2021-06-27 ENCOUNTER — Encounter (INDEPENDENT_AMBULATORY_CARE_PROVIDER_SITE_OTHER): Payer: Self-pay

## 2021-08-22 IMAGING — US US RENAL
1 series · 14 of 25 positions shown · non-contrast
Comparison: None.

CLINICAL DATA: Genetic abnormalities, concern for renal
abnormalities

EXAM:
RENAL / URINARY TRACT ULTRASOUND COMPLETE

[Series 1: us renal · 0.22mm/px · 14 of 29 slices shown]
[im 1/29]
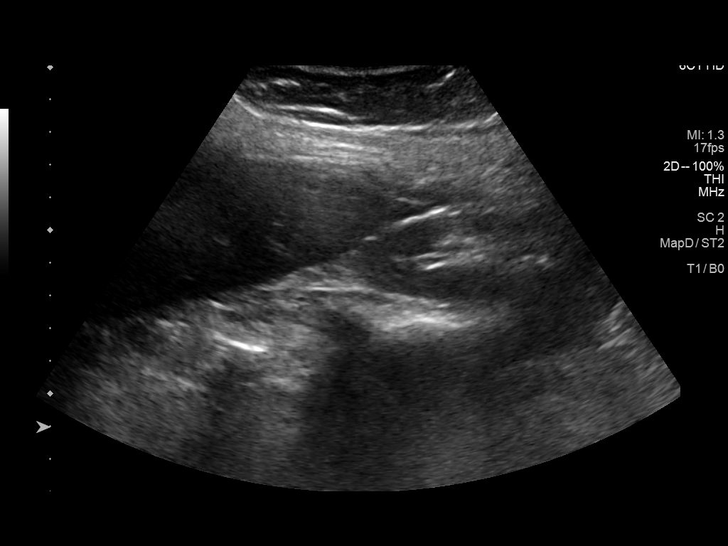
[im 3/29]
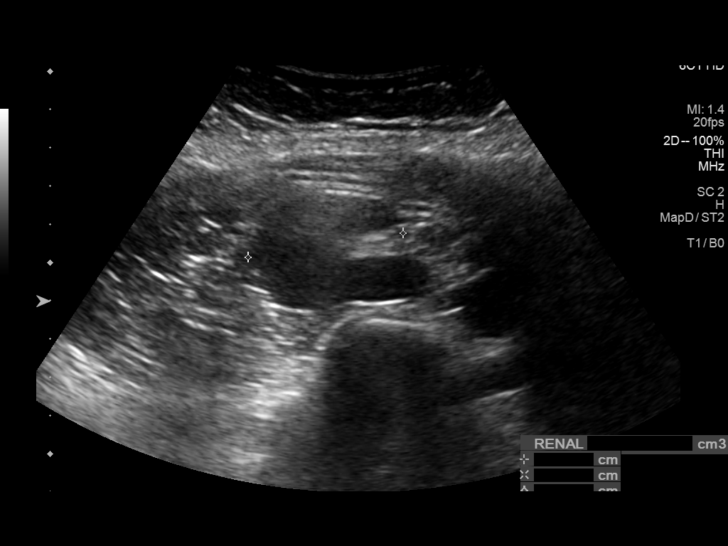
[im 5/29]
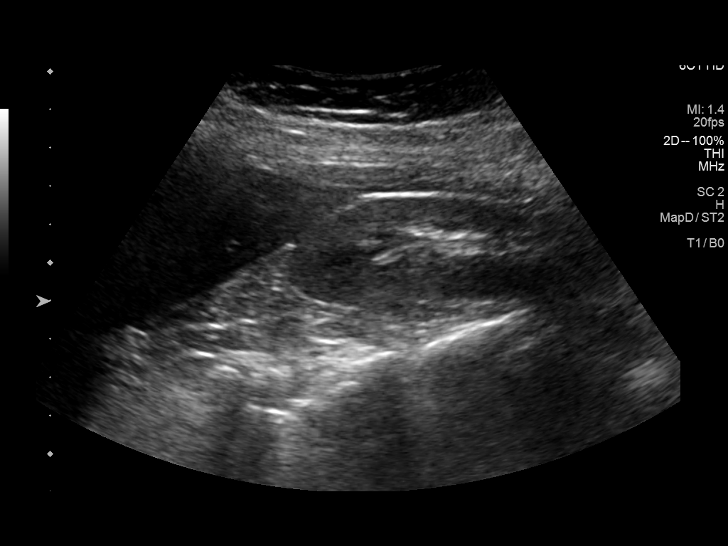
[im 8/29]
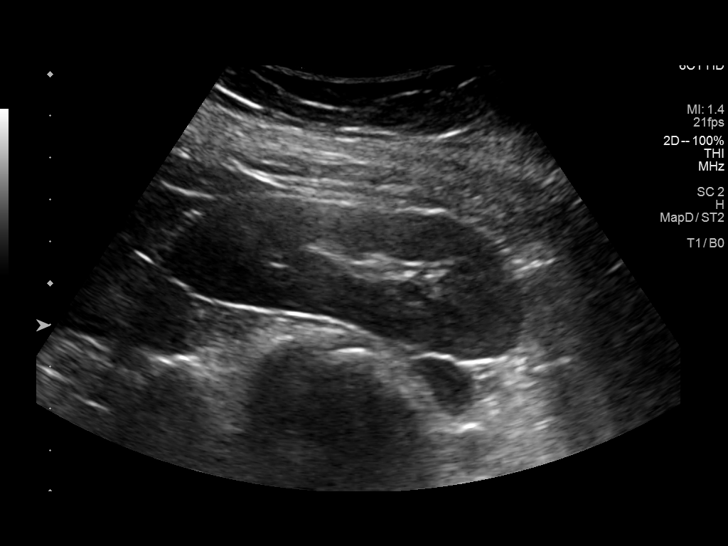
[im 10/29]
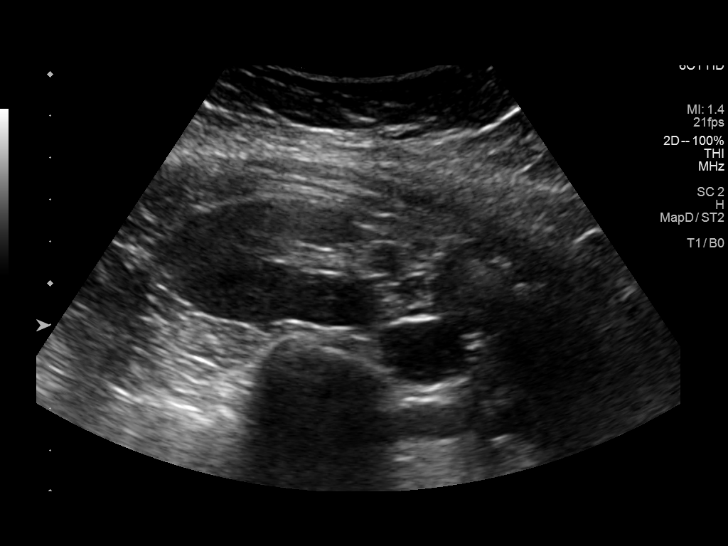
[im 11/29]
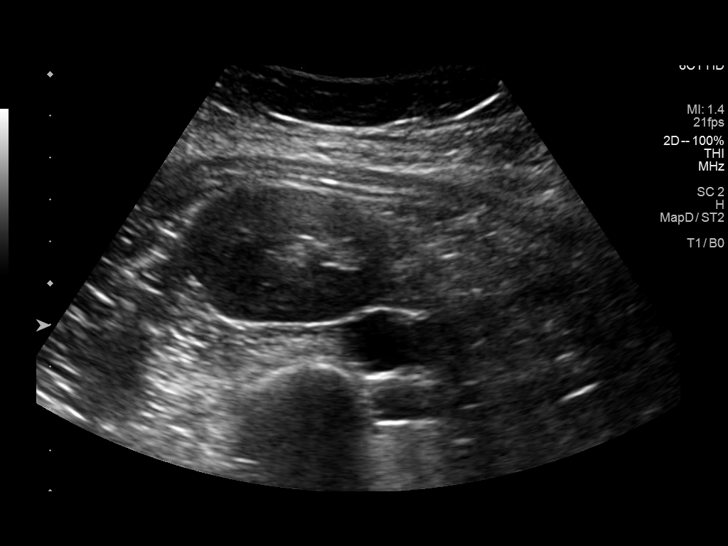
[im 13/29]
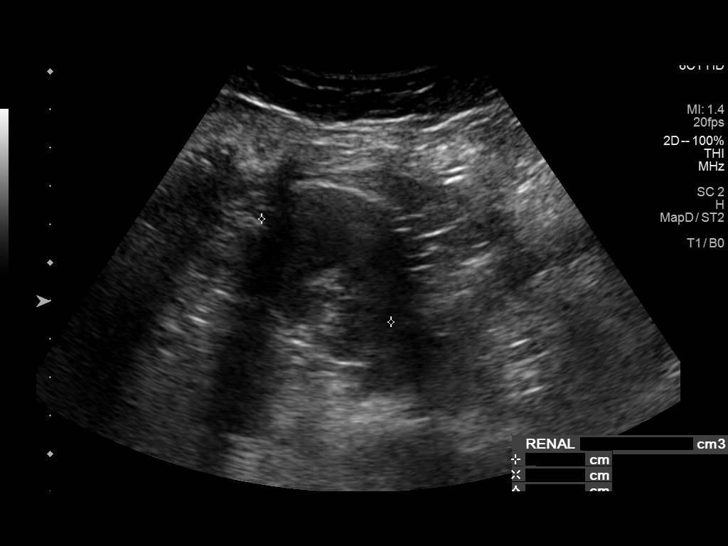
[im 16/29]
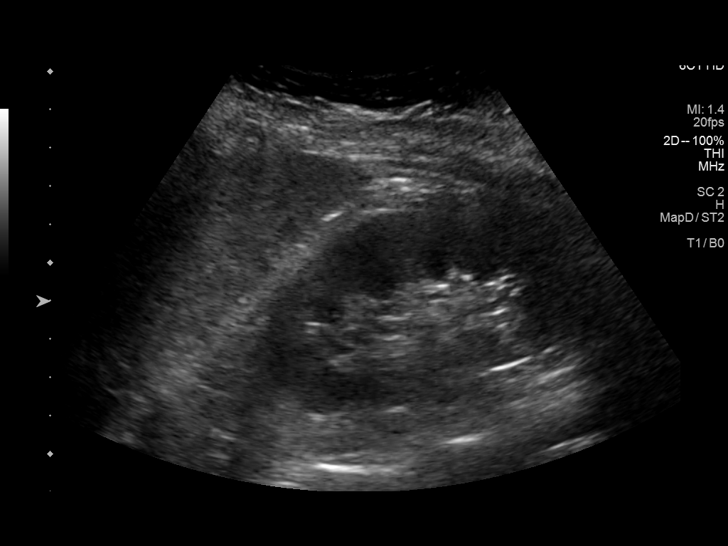
[im 18/29]
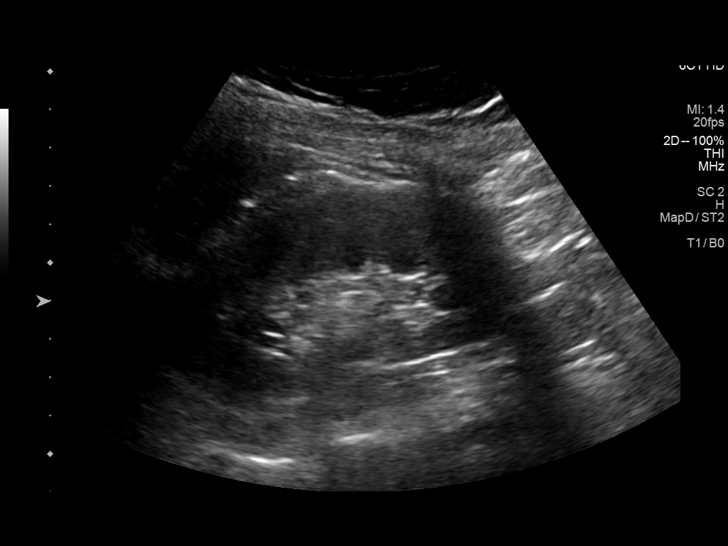
[im 19/29]
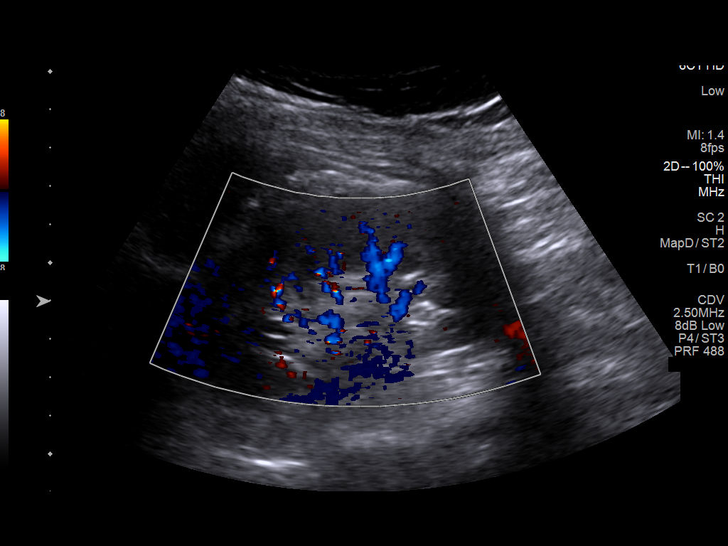
[im 22/29]
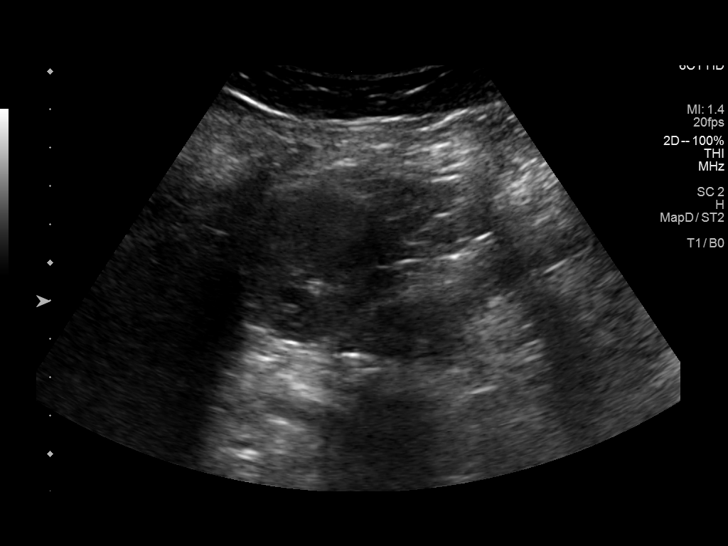
[im 24/29]
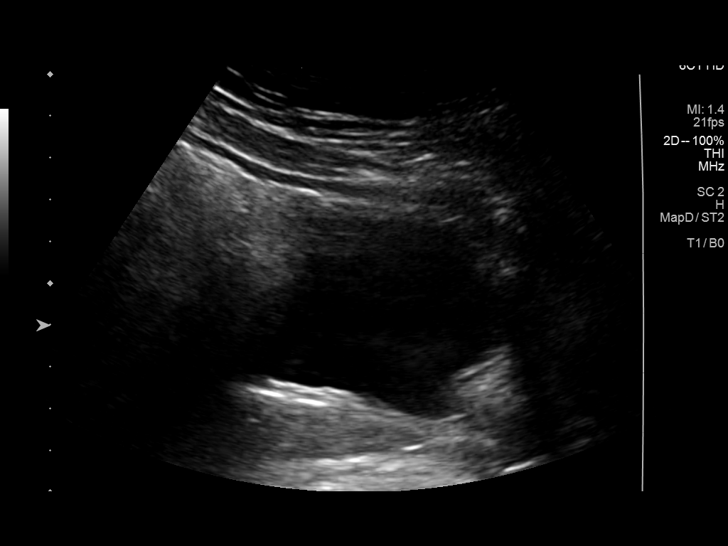
[im 26/29]
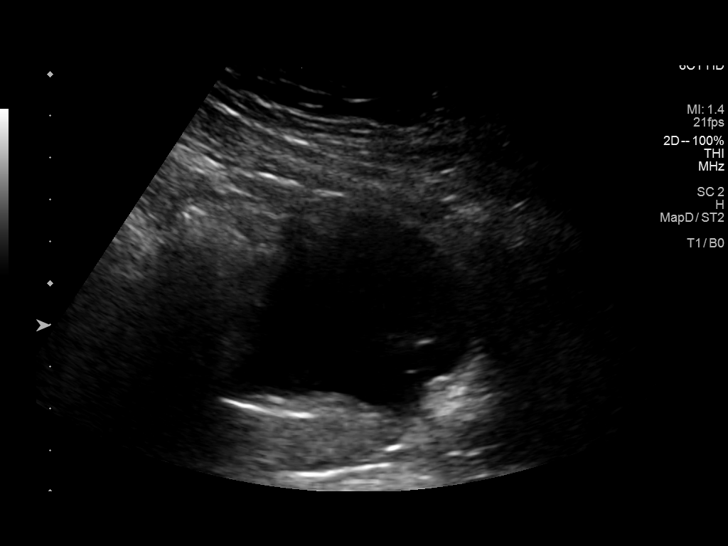
[im 29/29]
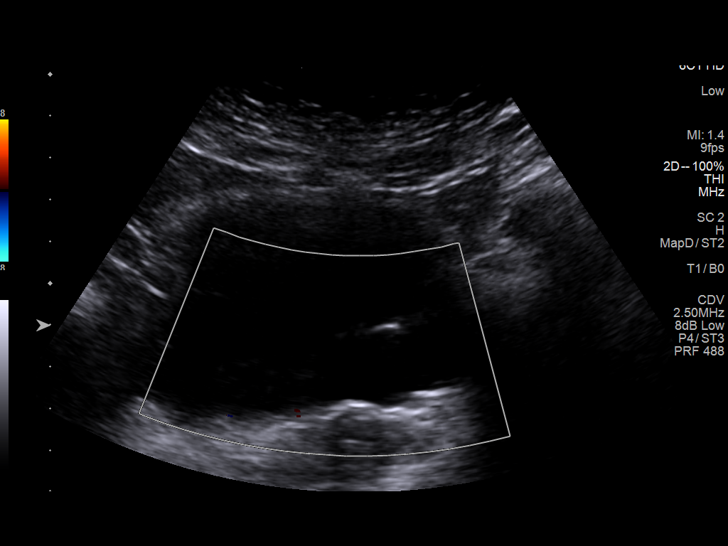

[14 of 25 positions shown; findings below may reference images not displayed]

FINDINGS: Right Kidney:

Renal measurements: 8.6 x 2.9 x 4.1 cm = volume: 54.1 mL.
Echogenicity within normal limits. No mass or hydronephrosis
visualized.

Left Kidney:

Renal measurements: 9.7 x 5.1 x 4.3 cm = volume: 112.4 mL.
Echogenicity within normal limits. No mass or hydronephrosis
visualized.

Bladder:

Grossly unremarkable for degree of bladder distension. Likely
reverberation artifact noted in the bladder lumen.

Other:

None.
IMPRESSION: 1. Unremarkable renal ultrasound.

## 2021-08-30 ENCOUNTER — Ambulatory Visit: Payer: Medicaid Other

## 2021-09-05 ENCOUNTER — Ambulatory Visit (INDEPENDENT_AMBULATORY_CARE_PROVIDER_SITE_OTHER): Payer: Medicaid Other

## 2021-09-05 ENCOUNTER — Other Ambulatory Visit: Payer: Self-pay

## 2021-09-05 DIAGNOSIS — Z3042 Encounter for surveillance of injectable contraceptive: Secondary | ICD-10-CM

## 2021-09-05 MED ORDER — MEDROXYPROGESTERONE ACETATE 150 MG/ML IM SUSP
150.0000 mg | Freq: Once | INTRAMUSCULAR | Status: AC
  Administered 2021-09-05: 150 mg via INTRAMUSCULAR

## 2021-09-05 NOTE — Progress Notes (Addendum)
Pt presents for depo injection. Pt within depo window, no urine hcg needed. Injection given, tolerated well. F/u depo injection visit scheduled.   First depo injection: 12/30/2019  Due for bone density: 12/29/2021  Patient last 3 weights:  Wt Readings from Last 3 Encounters:  06/22/21 149 lb 6.4 oz (67.8 kg) (80 %, Z= 0.83)*  06/19/21 143 lb (64.9 kg) (73 %, Z= 0.61)*  03/09/21 145 lb 9.6 oz (66 kg) (77 %, Z= 0.73)*   * Growth percentiles are based on CDC (Girls, 2-20 Years) data.    Last Blood Pressure: BP Readings from Last 3 Encounters:  06/22/21 122/76  03/09/21 122/78  02/10/21 120/80

## 2021-09-15 ENCOUNTER — Other Ambulatory Visit: Payer: Self-pay

## 2021-09-15 ENCOUNTER — Encounter: Payer: Self-pay | Admitting: Internal Medicine

## 2021-09-15 ENCOUNTER — Ambulatory Visit (INDEPENDENT_AMBULATORY_CARE_PROVIDER_SITE_OTHER): Payer: Medicaid Other | Admitting: Internal Medicine

## 2021-09-15 VITALS — BP 130/72 | HR 98 | Ht 60.5 in | Wt 147.8 lb

## 2021-09-15 DIAGNOSIS — R000 Tachycardia, unspecified: Secondary | ICD-10-CM | POA: Diagnosis not present

## 2021-09-15 NOTE — Progress Notes (Signed)
HPI Ms. Schlicker returns today for followup. She is apleasant 21 yo woman with autonomic dysfunction mostly POTS who I saw several months ago. At that time she admitted to dietary indiscretion and was drinking 3 or more sugar sweetened drinks a day. She has increased her salt intake and is walking some. She notes that her palpitations have improved. No syncope.  No Known Allergies   Current Outpatient Medications  Medication Sig Dispense Refill   Ginkgo Biloba 40 MG TABS Take by mouth.     ISOtretinoin (ACCUTANE) 30 MG capsule Take 1 capsule (30 mg total) by mouth 2 (two) times daily. 60 capsule 0   l-methylfolate-B6-B12 (METANX) 3-35-2 MG TABS tablet Take 1 tablet by mouth daily. 30 tablet 3   lamoTRIgine (LAMICTAL) 200 MG tablet Take 200 mg by mouth daily.     levothyroxine (SYNTHROID) 50 MCG tablet Take 50 mcg by mouth daily before breakfast.     medroxyPROGESTERone (DEPO-PROVERA) 150 MG/ML injection Inject 150 mg into the muscle every 3 (three) months.      OLANZapine (ZYPREXA) 10 MG tablet Take 10 mg by mouth at bedtime.     omeprazole (PRILOSEC) 20 MG capsule Take 1 capsule (20 mg total) by mouth daily. Take one capsule by mouth twice a day 60 capsule 5   No current facility-administered medications for this visit.     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 pylori infection 04/01/2020   Poor fetal growth    Ptosis    PTSD (post-traumatic stress disorder)    Scoliosis    Severe protein-calorie malnutrition Altamease Oiler: less than 60% of standard weight) ([place])     ROS:   All  systems reviewed and negative except as noted in the HPI.   Past Surgical History:  Procedure Laterality Date   BELPHAROPTOSIS REPAIR Left    Dr. Gevena Cotton   EYE SURGERY N/A    Phreesia 07/19/2020   scoliosis surgery  05/2017   [facility]   SPINE SURGERY N/A    Phreesia 12/30/2019   TYMPANOSTOMY TUBE PLACEMENT     1 1/20 yrs old     Family History  Problem Relation Age of Onset   Hypertension Maternal Grandmother    Diabetes Maternal Grandmother    Asthma Maternal Grandmother    Cancer Maternal Grandfather        blood   Diabetes Maternal Grandfather    Hypertension Maternal Grandfather    Heart disease Maternal Grandfather    Hyperlipidemia Maternal Grandfather    Cancer Paternal Grandfather        Lymphoma   Alcohol abuse Neg Hx    Arthritis Neg Hx    Birth defects Neg Hx    COPD Neg Hx    Depression Neg Hx    Drug abuse Neg Hx    Early death Neg Hx    Hearing loss Neg Hx    Kidney disease Neg Hx    Learning disabilities Neg Hx    Mental illness Neg Hx  Mental retardation Neg Hx    Miscarriages / Stillbirths Neg Hx    Stroke Neg Hx    Vision loss Neg Hx    Varicose Veins Neg Hx    Colon cancer Neg Hx    Esophageal cancer Neg Hx    Rectal cancer Neg Hx    Stomach cancer Neg Hx      Social History   Socioeconomic History   Marital status: Single    Spouse name: Not on file   Number of children: 0   Years of education: Not on file   Highest education level: Not on file  Occupational History   Not on file  Tobacco Use   Smoking status: Never    Passive exposure: Yes   Smokeless tobacco: Never   Tobacco comments:    Mom and dad smoke in home  Vaping Use   Vaping Use: Never used  Substance and Sexual Activity   Alcohol use: No    Alcohol/week: 0.0 standard drinks   Drug use: No   Sexual activity: Never    Birth control/protection: Abstinence  Other Topics Concern   Not on file  Social History Narrative   11/11/19  Lives with mom and brother. Dad no longer in the home. Cat and dog in household. Day school program. Last offically finished school 10th grade.   Social Determinants of Health   Financial Resource Strain: Not on file  Food Insecurity: Not on file  Transportation Needs: Not on file  Physical Activity: Not on file  Stress: Not on file  Social Connections: Not on file  Intimate Partner Violence: Not on file     BP 130/72    Pulse 98    Ht 5' 0.5" (1.537 m)    Wt 147 lb 12.8 oz (67 kg)    SpO2 97%    BMI 28.39 kg/m   Physical Exam:  Well appearing NAD HEENT: Unremarkable Neck:  No JVD, no thyromegally Lymphatics:  No adenopathy Back:  No CVA tenderness Lungs:  Clear with no wheezes HEART:  Regular rate rhythm, no murmurs, no rubs, no clicks Abd:  soft, positive bowel sounds, no organomegally, no rebound, no guarding Ext:  2 plus pulses, no edema, no cyanosis, no clubbing Skin:  No rashes no nodules Neuro:  CN II through XII intact, motor grossly intact  Assess/Plan:  Autonomic dysfunction - we discussed strategies for increasing the salt in her diet. I have encouraged her to walk every day. I have asked her to avoid caffeine. Weight gain - she will work on this. She is encouraged to avoid sugar sweetened beverages.   Carleene Overlie Margherita Collyer,MD

## 2021-09-15 NOTE — Patient Instructions (Addendum)
Medication Instructions:  °Your physician recommends that you continue on your current medications as directed. Please refer to the Current Medication list given to you today. ° °Labwork: °None ordered. ° °Testing/Procedures: °None ordered. ° °Follow-Up: °Your physician wants you to follow-up in: one year with Gregg Taylor, MD  °You will receive a reminder letter in the mail two months in advance. If you don't receive a letter, please call our office to schedule the follow-up appointment. ° °Any Other Special Instructions Will Be Listed Below (If Applicable). ° °If you need a refill on your cardiac medications before your next appointment, please call your pharmacy.  ° ° ° ° °

## 2021-09-23 ENCOUNTER — Ambulatory Visit (INDEPENDENT_AMBULATORY_CARE_PROVIDER_SITE_OTHER): Payer: Medicaid Other | Admitting: "Endocrinology

## 2021-09-25 NOTE — Progress Notes (Signed)
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 Pylorii gastritis.  HISTORY OF PRESENT ILLNESS:   [name] is a 21 y.o. Caucasian young woman.  [name] was accompanied by her mother.  1. [name] had her initial pediatric endocrine consultation on 06/22/21:  A. [name] presents today in referral from Dr. Barney Drainamgoolam for evaluation and treatment of rapid weight gain and elevated glucose.    1). Present illness: A). Height was at the 4.85% at age 21, increased to the 9.59% at age 21, and has plateaued at the 4.67%.    B). Weight was below the 3% through age 89, increased to the 2.85% at age 89, decreased to the <0.01% from age 20-19, and had since then increased dramatically to the 73% at age 21.     C). BMI curve followed the weight curve. Her most recent BMI was at the 90.97%.    D). [name] was diagnosed with major depressive disorder at about age 20-16 in freshman year in high school. She lost a large amount of weight at that time. She is followed at [facility]. [name] started [employer] in about 2019-2020. She was in [facility] from August 2019 to October 2020 for treatment for eating disorder and depression. In November 2020 her weight had increased to the 6.62%. [name] started Zyprexa shortly after her discharge from [facility], probably in early 2021.      E). [name] was then followed at [facility]. Unfortunately, [name]'s weight later decreased to the <0.01% in October 2021.     F). [name] was diagnosed with h. Pylorii gastritis in November 2021 and was treated with  antibiotics. Thereafter her appetite improved and she started gaining weight.  Her weight then increased 52 pounds in the following year.     G). In retrospect, [name] had also been eating a lot of carbs and desserts and drinking a lot of regular sodas, to include Dr. Reino KentPepper, and sweet juices.     H). On 06/17/21 [name] was seen by Dr. Barney Drainamgoolam for evaluation and treatment of dysuria. Urinalysis showed 4+ glucose, 2+ hemoglobin, 2+ leukocyte esterase, negative for ketones, negative nor nitrites, and trace for protein and urobilinogen. CBG was 269. She was scheduled to see me on 06/20/21. Unfortunately, that appointment was made in error and was changed to 06/22/21.   B. Pertinent past medical history:   1). Medical problems:       A). Idiopathic thoracolumbar scoliosis    B). Hypothyroidism, primary, acquired: Diagnosed in mid-2022    C). Acne, treated with Accutane    D). Genetic defect: paternally derived FLG abnormality, other VUS     E). Major depressive disorder: As noted above    F). Suspected autism disorder    G). Selective mutism as an adjustment disorder    H). Anorexia, self-imposed food restriction, severe protein-calorie malnutrition    I). ADHD, anxiety, PTSD (Mother does not know why.), hallucinations, intellectual disability    J). Tachycardia:     K). Activities: Sedentary, except for walking her dog     2). Surgeries: PE tubes; Scoliosis surgery 2018, two rods placed 01/18/20; repair of left eyelid ptosis  3). Allergies: No known medication allergies   4). GYN: Menarche occurred at age 75-15. Menses have always been irregular. She has been on depo-Provera since about 2021.   5). Medications: Nexium, 40 mg.day; levothyroxine 50 mcg/day; olanzapine, 15 mg at bedtime; l-methylfolate, one tablet daily; lamictal 200 mg  C. Pertinent family history:   1). Obesity: Mother, maternal grandmother, brother   2). DM: Maternal grandmother, mother, and maternal aunt have T2DM.    2).  Thyroid disease: Maternal grandmother and maternal aunt have underactive thyroid glands. Both ladies had Graves' disease.   3). ASCVD: Maternal grandfather had a stroke. Maternal aunt may have had a stroke.    4). Cancers: Paternal grandfather   5). Others: Maternal grandmother has hypertension and asthma. Maternal grandfather has hyperlipidemia. Dad is an alcoholic. Paternal grandfather had bad arthritis with gnarly hands. Brother has active carpal tunnel disease.   2. [name]'s last Endocrine Clinic visit occurred on 06/22/21. At that visit I started the patient on omeprazole, 20 mg, twice daily.  A. In the interim she has been healthy.   B. [name]'s voracious appetite has decreased. The omeprazole is working better than Nexium did.   C. She finished taking Accutane. She no longer takes methylfolate.   D. She has been much more tired. She does not stay up too late.   3. Pertinent Review of Systems:  Constitutional: The patient feels "good.  She does not have any significant complaints. Eyes: Vision is fairly good. She has glasses, but won't wear them. There are no significant eye complaints. Neck: The patient has no complaints of anterior neck swelling, soreness, tenderness,  pressure, discomfort, or difficulty swallowing.  Heart: Heart rate increases with exercise or other physical activity. The patient has no complaints of palpitations, irregular heat beats, chest pain, or chest pressure. Gastrointestinal: She has less belly hunger. Bowel movents seem normal. The patient has no complaints of excessive hunger, acid reflux, upset stomach, stomach aches or pains, diarrhea, or constipation. Hands: She sometimes has pains in her palms and wrists. Legs: Muscle mass and strength seem normal. There are no complaints of numbness, tingling, burning, or pain. No edema is noted. Feet: There are no obvious foot problems. There are no complaints of numbness, tingling, burning, or pain. No edema is noted. GYN:  [facility]    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 pylori infection 04/01/2020   Poor fetal growth    Ptosis    PTSD (post-traumatic stress disorder)    Scoliosis    Severe protein-calorie malnutrition [name]: less than 60% of standard weight) ([facility])     Family History  Problem Relation Age of Onset   Hypertension Maternal Grandmother    Diabetes Maternal Grandmother    Asthma Maternal Grandmother    Cancer Maternal Grandfather        blood   Diabetes Maternal Grandfather    Hypertension Maternal Grandfather    Heart disease Maternal Grandfather    Hyperlipidemia  Maternal Grandfather    Cancer Paternal Grandfather        Lymphoma   Alcohol abuse Neg Hx    Arthritis Neg Hx    Birth defects Neg Hx    COPD Neg Hx    Depression Neg Hx    Drug abuse Neg Hx    Early death Neg Hx    Hearing loss Neg Hx    Kidney disease Neg Hx    Learning disabilities Neg Hx    Mental illness Neg Hx    Mental retardation Neg Hx    Miscarriages / Stillbirths Neg Hx    Stroke Neg Hx    Vision loss Neg Hx    Varicose Veins Neg Hx    Colon cancer Neg Hx    Esophageal cancer Neg Hx    Rectal cancer Neg Hx    Stomach cancer Neg Hx      Current Outpatient Medications:    lamoTRIgine (LAMICTAL) 200 MG tablet, Take 200 mg by mouth daily., Disp: , Rfl:    levothyroxine (SYNTHROID) 50 MCG tablet, Take 50 mcg by mouth daily before breakfast., Disp: , Rfl:    medroxyPROGESTERone (DEPO-PROVERA) 150 MG/ML  injection, Inject 150 mg into the muscle every 3 (three) months. , Disp: , Rfl:    OLANZapine (ZYPREXA) 10 MG tablet, Take 10 mg by mouth at bedtime., Disp: , Rfl:    omeprazole (PRILOSEC) 20 MG capsule, Take 1 capsule (20 mg total) by mouth daily. Take one capsule by mouth twice a day, Disp: 60 capsule, Rfl: 5   Ginkgo Biloba 40 MG TABS, Take by mouth. (Patient not taking: Reported on 09/26/2021), Disp: , Rfl:    ISOtretinoin (ACCUTANE) 30 MG capsule, Take 1 capsule (30 mg total) by mouth 2 (two) times daily. (Patient not taking: Reported on 09/26/2021), Disp: 60 capsule, Rfl: 0   l-methylfolate-B6-B12 (METANX) 3-35-2 MG TABS tablet, Take 1 tablet by mouth daily. (Patient not taking: Reported on 09/26/2021), Disp: 30 tablet, Rfl: 3  Allergies as of 09/26/2021   (No Known Allergies)    1. Work and Family: Lives with mother and brother. Father is not in the home. Finished 10th grade. She goes to a day school program from 10:00 AM to 3:00 PM.  2. Activities: Sedentary, stays at home for the most part, but is walking more with the warmer weather[facility]  3. Smoking, alcohol, or drugs: None 4. Primary Care Provider: Estelle June, NP for now. I recommended Dr. Fabian Sharp.   REVIEW OF SYSTEMS: There are no other significant problems involving Kathy's other body systems.   Objective:  Vital Signs:  BP 116/74 (BP Location: Right Arm, Patient Position: Sitting, Cuff Size: Normal)    Pulse 68    Wt 145 lb 12.8 oz (66.1 kg)    BMI 28.01 kg/m    Ht Readings from Last 3 Encounters:  09/15/21 5' 0.5" (1.537 m)  06/22/21 5' 0.47" (1.536 m) (7 %, Z= -1.50)*  03/09/21 5' (1.524 m) (5 %, Z= -1.68)*   * Growth percentiles are based on CDC (Girls, 2-20 Years) data.   Wt Readings from Last 3 Encounters:  09/26/21 145 lb 12.8 oz (66.1 kg)  09/15/21 147 lb 12.8 oz (67 kg)  06/22/21 149 lb 6.4 oz (67.8 kg) (80 %, Z= 0.83)*   * Growth percentiles are based on CDC (Girls, 2-20 Years) data.   HC  Readings from Last 3 Encounters:  10/07/20 20.55" (52.2 cm)  05/06/20 20.08" (  51 cm)   Body surface area is 1.68 meters squared.  Facility age limit for growth percentiles is 20 years. Facility age limit for growth percentiles is 20 years.   PHYSICAL EXAM:  Constitutional: The patient appears healthy and overweight. Her height has plateaued. Her weight has decreased 4 pounds. Her BMI is approximately 27 kg/m2. She sat passively in her chair with her hands folded. She answered my questions with one-word answers. Her affect was flat, bur friendly. Her insight seemed limited.  Face: The face appears normal.  Eyes: There is no obvious arcus or proptosis. Moisture appears normal. Mouth: The oropharynx and tongue appear normal. Oral moisture is normal. Neck: The neck appears to be visibly normal. No carotid bruits are noted. The thyroid gland is slightly enlarged today at about 20+ grams in size. The consistency of the thyroid gland is normal. The thyroid gland is not tender to palpation. Lungs: The lungs are clear to auscultation. Air movement is good. Heart: Heart rate and rhythm are regular. Heart sounds S1 and S2 are normal. I did not appreciate any pathologic cardiac murmurs. Abdomen: The abdomen appears to be overweight. Bowel sounds are normal. There is no obvious hepatomegaly, splenomegaly, or other mass effect.  Arms: Muscle size and bulk are normal for age. Hands: There is no hand tremor. Phalangeal and metacarpophalangeal joints are normal. Palmar muscles are normal. Palmar skin is normal. Palmar moisture is also normal. She has trace pallor of several nail beds.  Legs: Muscles appear normal for age. No edema is present. Neurologic: Strength is normal for age in both the upper and lower extremities. Muscle tone is normal. Sensation to touch is normal in both legs.    LAB DATA:  Results for orders placed or performed in visit on 09/26/21 (from the past 504 hour(s))  POCT Glucose  (Device for Home Use)   Collection Time: 09/26/21  9:58 AM  Result Value Ref Range   Glucose Fasting, POC 99 70 - 99 mg/dL   POC Glucose      Labs 06/22/21: HbA1c 6.8%, CBG 126; C-peptide 7.38 (ref 0.80-3.85), TPO antibody 3 (ref <9), thyroglobulin antibody 3 (ref < or = 1)  Lab 06/17/21: HbA1c 7.1%; TSH 1.69, free T4 1.2, free T3 3.8; CMP normal, except glucose 104 and  AST 34 (ref 12-32); CBC abnormal, with RBC 5.45 (ref 3.80-5.10), MCV 77.6 (ref 80-100), MCH 24.8 (ref 27-33), MCHC 31.9 (ref 32-36); Urinalysis: glucose positive, ketones negative   Lab 06/14/21: Urine pregnancy test negative   Assessment and Plan:   ASSESSMENT:  1. New-onset T2DM:  A. Delonna has the personal history of being overweight and having new-onset DM. She also has the family history of obesity and T2DM.   B. Since her dyspepsia was stimulating her appetite excessively, she was a good candidate for treatment with omeprazole, with our Eat Right Diet plan, and with increasing her physical activity. We will consider metformin and GLP-1 agents in the future.   2. Overweight,with rapid weight gain: The patient's overly fat adipose cells produce excessive amount of cytokines that both directly and indirectly cause serious health problems.   A. Some cytokines cause hypertension. Other cytokines cause inflammation within arterial walls. Still other cytokines contribute to dyslipidemia. Yet other cytokines cause resistance to insulin and compensatory hyperinsulinemia.  B. The hyperinsulinemia, in turn, causes acquired acanthosis nigricans and  excess gastric acid production resulting in dyspepsia (excess belly hunger, upset stomach, and often stomach pains).   C. Hyperinsulinemia in children causes more  rapid linear growth than usual. The combination of tall child and heavy body stimulates the onset of central precocity in ways that we still do not understand. The final adult height is often much reduced.  D. Hyperinsulinemia  in women also stimulates excess production of testosterone by the ovaries and both androstenedione and DHEA by the adrenal glands, resulting in hirsutism, irregular menses, secondary amenorrhea, and infertility. This symptom complex is commonly called Polycystic Ovarian Syndrome, but many endocrinologists still prefer the diagnostic label of the Stein-leventhal Syndrome.  E. When the insulin resistance overwhelms the ability of the pancreatic beta cells to produce ever increasing amounts of insulin, glucose intolerance ensues. Initially the patients develop pre-diabetes. Unfortunately, unless the patient make the lifestyle changes that are needed to lose fat weight, they will usually progress to frank T2DM. Aleera has done so.  Derrill KayF. Osa has a C-peptide value that is about twice the upper limit of the normal range, but was not enough insulin to control her BGs at her previous level of sugar intake.  G. Since last visit Melvia has lost 4 pounds. She is trying to Eat Right and exercise more. However, if she does not continue to make the lifestyle changes she needs to, we will start metformin.  3. Hypothyroid, acquired, autoimmune:   A. Brylea has a family history of what sounds like autoimmune hyperthyroidism and hypothyroidism.  B. Since Chaquita did not have any thyroid surgery or irradiation herself, and since she was never placed on a prolonged low-iodine diet, it is very likely that Hashimoto's disease was the cause of her acquired hypothyroidism.   C.Her TFTs in November 2022 were euthyroid on her current levothyroxine dose of 50 mcg/day.   D. Her elevated thyroglobulin antibody confirms the clinical suspicion of Hashimoto's disease. 4. Elevated transaminase: This problem may be due to her rapid rise in fat weight.  5. Abnormal RBC indices: I do not yet understand the cause of these abnormalities. Her indices were normal in 2019-2021.  6. Dyspepsia: As above. She has done well with omeprazole thus far.  7.  Family history thyroid disease: As above 8. Fatigue, other: Mother says that [name] has been more tired recently. We will repeat her TFTs, CBC, and iron today   PLAN:  1. Diagnostic: HbA1c, CBC, TFTs, iron today 2. Therapeutic: Continue omeprazole 20 mg, twice a day. Eat Right Diet. Walk for an hour a day. Consider adding metformin.  3. Patient education: We discussed all of the above at great length, to include discussing Hashimoto's disease. .  4. Follow-up: 3 months  Level of Service: This visit lasted in excess of 50 minutes. More than 50% of the visit was devoted to counseling.  Molli KnockMichael Celestia Duva, MD, CDE Adult and Pediatric Endocrinology

## 2021-09-26 ENCOUNTER — Encounter (INDEPENDENT_AMBULATORY_CARE_PROVIDER_SITE_OTHER): Payer: Self-pay | Admitting: "Endocrinology

## 2021-09-26 ENCOUNTER — Ambulatory Visit (INDEPENDENT_AMBULATORY_CARE_PROVIDER_SITE_OTHER): Payer: Medicaid Other | Admitting: "Endocrinology

## 2021-09-26 ENCOUNTER — Other Ambulatory Visit: Payer: Self-pay

## 2021-09-26 VITALS — BP 116/74 | HR 68 | Wt 145.8 lb

## 2021-09-26 DIAGNOSIS — E11649 Type 2 diabetes mellitus with hypoglycemia without coma: Secondary | ICD-10-CM | POA: Diagnosis not present

## 2021-09-26 DIAGNOSIS — E663 Overweight: Secondary | ICD-10-CM | POA: Diagnosis not present

## 2021-09-26 DIAGNOSIS — E063 Autoimmune thyroiditis: Secondary | ICD-10-CM

## 2021-09-26 DIAGNOSIS — E049 Nontoxic goiter, unspecified: Secondary | ICD-10-CM

## 2021-09-26 DIAGNOSIS — R5383 Other fatigue: Secondary | ICD-10-CM

## 2021-09-26 DIAGNOSIS — R718 Other abnormality of red blood cells: Secondary | ICD-10-CM

## 2021-09-26 DIAGNOSIS — E1165 Type 2 diabetes mellitus with hyperglycemia: Secondary | ICD-10-CM

## 2021-09-26 LAB — POCT GLUCOSE (DEVICE FOR HOME USE): Glucose Fasting, POC: 99 mg/dL (ref 70–99)

## 2021-09-26 NOTE — Patient Instructions (Signed)
Follow up visit in 3 months. 

## 2021-09-27 LAB — CBC WITH DIFFERENTIAL/PLATELET
Absolute Monocytes: 415 cells/uL (ref 200–950)
Basophils Absolute: 58 cells/uL (ref 0–200)
Basophils Relative: 0.7 %
Eosinophils Absolute: 208 cells/uL (ref 15–500)
Eosinophils Relative: 2.5 %
HCT: 44.9 % (ref 35.0–45.0)
Hemoglobin: 14 g/dL (ref 11.7–15.5)
Lymphs Abs: 2490 cells/uL (ref 850–3900)
MCH: 24.1 pg — ABNORMAL LOW (ref 27.0–33.0)
MCHC: 31.2 g/dL — ABNORMAL LOW (ref 32.0–36.0)
MCV: 77.3 fL — ABNORMAL LOW (ref 80.0–100.0)
MPV: 11 fL (ref 7.5–12.5)
Monocytes Relative: 5 %
Neutro Abs: 5129 cells/uL (ref 1500–7800)
Neutrophils Relative %: 61.8 %
Platelets: 306 10*3/uL (ref 140–400)
RBC: 5.81 10*6/uL — ABNORMAL HIGH (ref 3.80–5.10)
RDW: 14 % (ref 11.0–15.0)
Total Lymphocyte: 30 %
WBC: 8.3 10*3/uL (ref 3.8–10.8)

## 2021-09-27 LAB — T4, FREE: Free T4: 1.4 ng/dL (ref 0.8–1.4)

## 2021-09-27 LAB — HEMOGLOBIN A1C
Hgb A1c MFr Bld: 5.5 % of total Hgb (ref <5.7)
Mean Plasma Glucose: 111 mg/dL
eAG (mmol/L): 6.2 mmol/L

## 2021-09-27 LAB — IRON: Iron: 44 ug/dL (ref 40–190)

## 2021-09-27 LAB — T3, FREE: T3, Free: 3.5 pg/mL (ref 3.0–4.7)

## 2021-09-27 LAB — TSH: TSH: 1.13 mIU/L

## 2021-11-21 ENCOUNTER — Ambulatory Visit (INDEPENDENT_AMBULATORY_CARE_PROVIDER_SITE_OTHER): Payer: Medicaid Other

## 2021-11-21 DIAGNOSIS — Z3042 Encounter for surveillance of injectable contraceptive: Secondary | ICD-10-CM | POA: Diagnosis not present

## 2021-11-21 MED ORDER — MEDROXYPROGESTERONE ACETATE 150 MG/ML IM SUSP
150.0000 mg | Freq: Once | INTRAMUSCULAR | Status: AC
  Administered 2021-11-21: 150 mg via INTRAMUSCULAR

## 2021-11-21 NOTE — Progress Notes (Signed)
Pt presents for depo injection. Pt within depo window, no urine hcg needed. Injection given, tolerated well. F/u depo injection visit scheduled.  ? ?First depo injection:  ? ?Due for bone density:  ? ?Patient last 3 weights:  ? ?Last Blood Pressure: ? ?

## 2021-12-27 ENCOUNTER — Other Ambulatory Visit: Payer: Self-pay | Admitting: Pediatrics

## 2021-12-27 ENCOUNTER — Other Ambulatory Visit (INDEPENDENT_AMBULATORY_CARE_PROVIDER_SITE_OTHER): Payer: Self-pay | Admitting: "Endocrinology

## 2021-12-27 ENCOUNTER — Ambulatory Visit (INDEPENDENT_AMBULATORY_CARE_PROVIDER_SITE_OTHER): Payer: Medicaid Other | Admitting: "Endocrinology

## 2021-12-27 DIAGNOSIS — E032 Hypothyroidism due to medicaments and other exogenous substances: Secondary | ICD-10-CM

## 2021-12-27 NOTE — Progress Notes (Deleted)
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 Pylorii gastritis.  HISTORY OF PRESENT ILLNESS:   [name] is a 21 y.o. Caucasian young woman.  Gilberte was accompanied by her mother.  1. [name] had her initial pediatric endocrine consultation on 06/22/21:  [name] presents today in referral from Dr. Laurice Record for evaluation and treatment of rapid weight gain and elevated glucose.    1). Present illness: A). Height was at the 4.85% at age 21, increased to the 9.59% at age 21, and has plateaued at the 4.67%.    B). Weight was below the 3% through age 21, increased to the 2.85% at age 21, decreased to the <0.01% from age [age], and had since then increased dramatically to the 73% at age 21.     C). BMI curve followed the weight curve. Her most recent BMI was at the 90.97%.    D). [name] was diagnosed with major depressive disorder at about age 20-16 in freshman year in high school. She lost a large amount of weight at that time. She is followed at [facility]. [name] started [employer] in about 2019-2020. She was in [facility] from August 2019 to October 2020 for treatment for eating disorder and depression. In November 2020 her weight had increased to the 6.62%. Mikenzie started Zyprexa shortly after her discharge from [facility], probably in early 2021.      E). [name] was then followed at the [facility]. Unfortunately, [name]'s weight later decreased to the <0.01% in October 2021.     F). [name] was diagnosed with h. Pylorii gastritis in November 2021 and was treated with  antibiotics. Thereafter her appetite improved and she started gaining weight.  Her weight then increased 52 pounds in the following year.     G). In retrospect, [name] had also been eating a lot of carbs and desserts and drinking a lot of regular sodas, to include Dr. Malachi Bonds, and sweet juices.     H). On 06/17/21 [name] was seen by Dr. Laurice Record for evaluation and treatment of dysuria. Urinalysis showed 4+ glucose, 2+ hemoglobin, 2+ leukocyte esterase, negative for ketones, negative nor nitrites, and trace for protein and urobilinogen. CBG was 269. She was scheduled to see me on 06/20/21. Unfortunately, that appointment was made in error and was changed to 06/22/21.   B. Pertinent past medical history:   1). Medical problems:       A). Idiopathic thoracolumbar scoliosis    B). Hypothyroidism, primary, acquired: Diagnosed in [place]). Acne, treated with Accutane    D). Genetic defect: paternally derived FLG abnormality, other VUS     E). Major depressive disorder: As noted above    F). Suspected autism disorder    G). Selective mutism as an adjustment disorder    H). Anorexia, self-imposed food restriction, severe protein-calorie malnutrition    I). ADHD, anxiety, PTSD (Mother does not know why.), hallucinations, intellectual disability    J). Tachycardia:     K). Activities: Sedentary, except for walking her dog     2). Surgeries: PE tubes; Scoliosis surgery 2018, two rods placed 01/18/20; repair of left eyelid ptosis  3). Allergies: No known medication allergies   4). GYN: Menarche occurred at age 29-15. Menses have always been irregular. She has been on depo-Provera since about 2021.   5). Medications: Nexium, 40 mg.day; levothyroxine 50 mcg/day; olanzapine, 15 mg at bedtime; l-methylfolate, one tablet daily; lamictal 200 mg  C. Pertinent family history:   1). Obesity: Mother, maternal grandmother, brother   2). DM: Maternal grandmother, mother, and maternal aunt have T2DM.    2).  Thyroid disease: Maternal grandmother and maternal aunt have underactive thyroid glands. Both ladies had Graves' disease.   3). ASCVD: Maternal grandfather had a stroke. Maternal aunt may have had a stroke.    4). Cancers: Paternal grandfather   26). Others: Maternal grandmother has hypertension and asthma. Maternal grandfather has hyperlipidemia. Dad is an alcoholic. Paternal grandfather had bad arthritis with gnarly hands. Brother has active carpal tunnel disease.   2. [name]'s last Endocrine Clinic visit occurred on 09/22/20. At that visit I continued the patient on omeprazole, 20 mg, twice daily.  A. In the interim she has been healthy.   B. [name]'s voracious appetite has decreased. The omeprazole is working better than Nexium did.   C. She finished taking Accutane. She no longer takes methylfolate.   D. She has been much more tired. She does not stay up too late.   3. Pertinent Review of Systems:  Constitutional: The patient feels "good.  She does not have any significant complaints. Eyes: Vision is fairly good. She has glasses, but won't wear them. There are no significant eye complaints. Neck: The patient has no complaints of anterior neck swelling, soreness, tenderness,  pressure, discomfort, or difficulty swallowing.  Heart: Heart rate increases with exercise or other physical activity. The patient has no complaints of palpitations, irregular heat beats, chest pain, or chest pressure. Gastrointestinal: She has less belly hunger. Bowel movents seem normal. The patient has no complaints of excessive hunger, acid reflux, upset stomach, stomach aches or pains, diarrhea, or constipation. Hands: She sometimes has pains in her palms and wrists. Legs: Muscle mass and strength seem normal. There are no complaints of numbness, tingling, burning, or pain. No edema is noted. Feet: There are no obvious foot problems. There are no complaints of numbness, tingling, burning, or pain. No edema is  noted. GYN: [facility]    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 pylori infection 04/01/2020   Poor fetal growth    Ptosis    PTSD (post-traumatic stress disorder)    Scoliosis    Severe protein-calorie malnutrition Altamease Oiler: less than 60% of standard weight) ([place])     Family History  Problem Relation Age of Onset   Hypertension Maternal Grandmother    Diabetes Maternal Grandmother    Asthma Maternal Grandmother    Cancer Maternal Grandfather        blood   Diabetes Maternal Grandfather    Hypertension Maternal Grandfather    Heart disease Maternal Grandfather    Hyperlipidemia  Maternal Grandfather    Cancer Paternal Grandfather        Lymphoma   Alcohol abuse Neg Hx    Arthritis Neg Hx    Birth defects Neg Hx    COPD Neg Hx    Depression Neg Hx    Drug abuse Neg Hx    Early death Neg Hx    Hearing loss Neg Hx    Kidney disease Neg Hx    Learning disabilities Neg Hx    Mental illness Neg Hx    Mental retardation Neg Hx    Miscarriages / Stillbirths Neg Hx    Stroke Neg Hx    Vision loss Neg Hx    Varicose Veins Neg Hx    Colon cancer Neg Hx    Esophageal cancer Neg Hx    Rectal cancer Neg Hx    Stomach cancer Neg Hx      Current Outpatient Medications:    Ginkgo Biloba 40 MG TABS, Take by mouth. (Patient not taking: Reported on 09/26/2021), Disp: , Rfl:    ISOtretinoin (ACCUTANE) 30 MG capsule, Take 1 capsule (30 mg total) by mouth 2 (two) times daily. (Patient not taking:  Reported on 09/26/2021), Disp: 60 capsule, Rfl: 0   l-methylfolate-B6-B12 (METANX) 3-35-2 MG TABS tablet, Take 1 tablet by mouth daily. (Patient not taking: Reported on 09/26/2021), Disp: 30 tablet, Rfl: 3   lamoTRIgine (LAMICTAL) 200 MG tablet, Take 200 mg by mouth daily., Disp: , Rfl:    levothyroxine (SYNTHROID) 50 MCG tablet, Take 50 mcg by mouth daily before breakfast., Disp: , Rfl:    medroxyPROGESTERone (DEPO-PROVERA) 150 MG/ML injection, Inject 150 mg into the muscle every 3 (three) months. , Disp: , Rfl:    OLANZapine (ZYPREXA) 10 MG tablet, Take 10 mg by mouth at bedtime., Disp: , Rfl:    omeprazole (PRILOSEC) 20 MG capsule, Take 1 capsule (20 mg total) by mouth daily. Take one capsule by mouth twice a day, Disp: 60 capsule, Rfl: 5  Allergies as of 12/27/2021   (No Known Allergies)    1. Work and Family: Lives with mother and brother. Father is not in the home. Finished 10th grade. She goes to a day school program from 10:00 AM to 3:00 PM.  2. Activities: Sedentary, stays at home for the most part, but is walking more with the warmer weather. [facility]  3. Smoking, alcohol, or drugs: None 4. Primary Care Provider: Leveda Anna, NP for now. I recommended Dr. Regis Bill.   REVIEW OF SYSTEMS: There are no other significant problems involving Anjelita's other body systems.   Objective:  Vital Signs:  There were no vitals taken for this visit.   Ht Readings from Last 3 Encounters:  09/15/21 5' 0.5" (1.537 m)  06/22/21 5' 0.47" (1.536 m) (7 %, Z= -1.50)*  03/09/21 5' (1.524 m) (5 %, Z= -1.68)*   * Growth percentiles are based on CDC (Girls, 2-20 Years) data.   Wt Readings from Last 3 Encounters:  09/26/21 145 lb 12.8 oz (66.1 kg)  09/15/21 147 lb 12.8 oz (67 kg)  06/22/21 149 lb 6.4 oz (67.8 kg) (80 %, Z= 0.83)*   * Growth percentiles are based on CDC (Girls, 2-20 Years) data.   HC Readings from Last 3 Encounters:  10/07/20 20.55" (52.2 cm)  05/06/20 20.08" (51 cm)    There is no height or weight on file to calculate BSA.  Facility age limit for growth percentiles is 20 years. Facility  age limit for growth percentiles is 20 years.   PHYSICAL EXAM:  Constitutional: The patient appears healthy and overweight. Her height has plateaued. Her weight has decreased 4 pounds. Her BMI is approximately 27 kg/m2. She sat passively in her chair with her hands folded. She answered my questions with one-word answers. Her affect was flat, bur friendly. Her insight seemed limited.  Face: The face appears normal.  Eyes: There is no obvious arcus or proptosis. Moisture appears normal. Mouth: The oropharynx and tongue appear normal. Oral moisture is normal. Neck: The neck appears to be visibly normal. No carotid bruits are noted. The thyroid gland is slightly enlarged today at about 20+ grams in size. The consistency of the thyroid gland is normal. The thyroid gland is not tender to palpation. Lungs: The lungs are clear to auscultation. Air movement is good. Heart: Heart rate and rhythm are regular. Heart sounds S1 and S2 are normal. I did not appreciate any pathologic cardiac murmurs. Abdomen: The abdomen appears to be overweight. Bowel sounds are normal. There is no obvious hepatomegaly, splenomegaly, or other mass effect.  Arms: Muscle size and bulk are normal for age. Hands: There is no hand tremor. Phalangeal and metacarpophalangeal joints are normal. Palmar muscles are normal. Palmar skin is normal. Palmar moisture is also normal. She has trace pallor of several nail beds.  Legs: Muscles appear normal for age. No edema is present. Neurologic: Strength is normal for age in both the upper and lower extremities. Muscle tone is normal. Sensation to touch is normal in both legs.    LAB DATA:  No results found for this or any previous visit (from the past 504 hour(s)).  Labs 09/26/21: HbA1c 5.5%; TSH 1.13, free T4 1.4, free T3 3.5; CBC abnormal with RBC 5.81 (3.80-5.10),  MCV 77.3 (ref 80-100), MCH 24.1 (ref 27-33)m and MCHC 31.2 (ref 32-36); iron 44 (ref 40-190)  Labs 06/22/21: HbA1c 6.8%, CBG 126; C-peptide 7.38 (ref 0.80-3.85), TPO antibody 3 (ref <9), thyroglobulin antibody 3 (ref < or = 1)  Lab 06/17/21: HbA1c 7.1%; TSH 1.69, free T4 1.2, free T3 3.8; CMP normal, except glucose 104 and  AST 34 (ref 12-32); CBC abnormal, with RBC 5.45 (ref 3.80-5.10), MCV 77.6 (ref 80-100), MCH 24.8 (ref 27-33), MCHC 31.9 (ref 32-36); Urinalysis: glucose positive, ketones negative   Lab 06/14/21: Urine pregnancy test negative   Assessment and Plan:   ASSESSMENT:  1. New-onset T2DM:  A. Juelz has the personal history of being overweight and having new-onset DM. She also has the family history of obesity and T2DM.   B. Since her dyspepsia was stimulating her appetite excessively, she was a good candidate for treatment with omeprazole, with our Eat Right Diet plan, and with increasing her physical activity. We will consider metformin and GLP-1 agents in the future.   2. Overweight,with rapid weight gain: The patient's overly fat adipose cells produce excessive amount of cytokines that both directly and indirectly cause serious health problems.   A. Some cytokines cause hypertension. Other cytokines cause inflammation within arterial walls. Still other cytokines contribute to dyslipidemia. Yet other cytokines cause resistance to insulin and compensatory hyperinsulinemia.  B. The hyperinsulinemia, in turn, causes acquired acanthosis nigricans and  excess gastric acid production resulting in dyspepsia (excess belly hunger, upset stomach, and often stomach pains).   C. Hyperinsulinemia in children causes more rapid linear growth than usual. The combination of tall child and heavy body stimulates the onset of central precocity in ways that we still do  not understand. The final adult height is often much reduced.  D. Hyperinsulinemia in women also stimulates excess production of  testosterone by the ovaries and both androstenedione and DHEA by the adrenal glands, resulting in hirsutism, irregular menses, secondary amenorrhea, and infertility. This symptom complex is commonly called Polycystic Ovarian Syndrome, but many endocrinologists still prefer the diagnostic label of the Stein-leventhal Syndrome.  E. When the insulin resistance overwhelms the ability of the pancreatic beta cells to produce ever increasing amounts of insulin, glucose intolerance ensues. Initially the patients develop pre-diabetes. Unfortunately, unless the patient make the lifestyle changes that are needed to lose fat weight, they will usually progress to frank T2DM. Ashlynd has done so.  Georgina Pillion has a C-peptide value that is about twice the upper limit of the normal range, but was not enough insulin to control her BGs at her previous level of sugar intake.  G. Since last visit Sadye has lost 4 pounds. She is trying to Eat Right and exercise more. However, if she does not continue to make the lifestyle changes she needs to, we will start metformin.  3. Hypothyroid, acquired, autoimmune:   A. Iline has a family history of what sounds like autoimmune hyperthyroidism and hypothyroidism.  B. Since Carrera did not have any thyroid surgery or irradiation herself, and since she was never placed on a prolonged low-iodine diet, it is very likely that Hashimoto's disease was the cause of her acquired hypothyroidism.   C.Her TFTs in November 2022 were euthyroid on her current levothyroxine dose of 50 mcg/day.   D. Her elevated thyroglobulin antibody confirms the clinical suspicion of Hashimoto's disease. 4. Elevated transaminase: This problem may be due to her rapid rise in fat weight.  5. Abnormal RBC indices: I do not yet understand the cause of these abnormalities. Her indices were normal in 2019-2021.  6. Dyspepsia: As above. She has done well with omeprazole thus far.  7. Family history thyroid disease: As above 8.  Fatigue, other: Mother says that Julysa has been more tired recently. We will repeat her TFTs, CBC, and iron today   PLAN:  1. Diagnostic: HbA1c, CBC, TFTs, iron today 2. Therapeutic: Continue omeprazole 20 mg, twice a day. Eat Right Diet. Walk for an hour a day. Consider adding metformin.  3. Patient education: We discussed all of the above at great length, to include discussing Hashimoto's disease. .  4. Follow-up: 3 months  Level of Service: This visit lasted in excess of 50 minutes. More than 50% of the visit was devoted to counseling.  Tillman Sers, MD, CDE Adult and Pediatric Endocrinology

## 2022-02-06 ENCOUNTER — Ambulatory Visit: Payer: Medicaid Other

## 2022-02-08 ENCOUNTER — Encounter: Payer: Self-pay | Admitting: Family

## 2022-02-08 ENCOUNTER — Telehealth: Payer: Medicaid Other | Admitting: Family

## 2022-02-08 ENCOUNTER — Ambulatory Visit (INDEPENDENT_AMBULATORY_CARE_PROVIDER_SITE_OTHER): Payer: Medicaid Other | Admitting: Family

## 2022-02-08 VITALS — BP 116/76 | HR 104 | Ht 61.0 in | Wt 139.0 lb

## 2022-02-08 DIAGNOSIS — N898 Other specified noninflammatory disorders of vagina: Secondary | ICD-10-CM

## 2022-02-08 DIAGNOSIS — Z3042 Encounter for surveillance of injectable contraceptive: Secondary | ICD-10-CM

## 2022-02-08 DIAGNOSIS — Z7187 Encounter for pediatric-to-adult transition counseling: Secondary | ICD-10-CM

## 2022-02-08 DIAGNOSIS — Z3049 Encounter for surveillance of other contraceptives: Secondary | ICD-10-CM

## 2022-02-08 MED ORDER — MEDROXYPROGESTERONE ACETATE 150 MG/ML IM SUSP
150.0000 mg | Freq: Once | INTRAMUSCULAR | Status: AC
  Administered 2022-02-08: 150 mg via INTRAMUSCULAR

## 2022-02-08 NOTE — Progress Notes (Signed)
THIS RECORD MAY CONTAIN CONFIDENTIAL INFORMATION THAT SHOULD NOT BE RELEASED WITHOUT REVIEW OF THE SERVICE PROVIDER.  Virtual Follow-Up Visit via Video Note  I connected with [name]  on 02/08/22 at  3:30 PM EDT by a video enabled telemedicine application and verified that I am speaking with the correct person using two identifiers.   Patient/parent location: home  Provider location: office [place]    I discussed the limitations of evaluation and management by telemedicine and the availability of in person appointments.  I discussed that the purpose of this telehealth visit is to provide medical care while limiting exposure to the novel coronavirus.  The patient expressed understanding and agreed to proceed.   [name] is a 21 y.o. female referred by Marissa June, NP here today for follow-up of vaginal discharge, Depo.   History was provided by the patient.  Supervising Physician: Dr. Delorse [name]  Plan from Last Visit:   -Depo earlier today in clinic   Chief Complaint: Vaginal discharge   History of Present Illness:  -vaginal discharge: having clear vaginal discharge  -normaly takes 800 mg ibu for crampng with helps  -needs Referral to [facility] for primary care  -Out of synthroid, hasn't had it for a while  -Discussed bone density needed due to > 2 years on Depo in order to assess if estrogen suppression due to prolonged Depo use. She is agreeable.   Outpatient Medications Prior to Visit  Medication Sig Dispense Refill   Ginkgo Biloba 40 MG TABS Take by mouth. (Patient not taking: Reported on 09/26/2021)     ISOtretinoin (ACCUTANE) 30 MG capsule Take 1 capsule (30 mg total) by mouth 2 (two) times daily. (Patient not taking: Reported on 09/26/2021) 60 capsule 0   l-methylfolate-B6-B12 (METANX) 3-35-2 MG TABS tablet Take 1 tablet by mouth daily. (Patient not taking: Reported on 09/26/2021) 30 tablet 3   lamoTRIgine (LAMICTAL) 200 MG tablet Take 200 mg by  mouth daily.     levothyroxine (SYNTHROID) 50 MCG tablet Take 50 mcg by mouth daily before breakfast. (Patient not taking: Reported on 02/08/2022)     medroxyPROGESTERone (DEPO-PROVERA) 150 MG/ML injection Inject 150 mg into the muscle every 3 (three) months.      OLANZapine (ZYPREXA) 10 MG tablet Take 10 mg by mouth at bedtime.     omeprazole (PRILOSEC) 20 MG capsule TAKE 1 CAPSULE BY MOUTH TWICE A DAY 60 capsule 5   No facility-administered medications prior to visit.     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   Encounter for medication monitoring 04/23/2020   Major depressive disorder, recurrent episode with mixed features (HCC) 02/10/2020   Suspected autism disorder 11/25/2019   Failed vision screen 11/25/2019   Impaired functional mobility, balance, gait, and endurance 11/25/2019   Irregular periods/menstrual cycles 11/04/2019   Tachycardia 10/30/2019   Transient alteration of awareness 08/13/2019   Acne vulgaris 06/30/2019   Need for prophylactic vaccination and inoculation against influenza 05/15/2019   Self-imposed food restriction 05/15/2019   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   Verbal auditory hallucinations 05/15/2019   Encounter for well child exam with abnormal findings 02/26/2018   Mood disorder (HCC)    Anorexia 02/20/2018   Learning disability 01/16/2018   Eating disorder    Intermittent epigastric abdominal pain 12/03/2017  Scoliosis 11/06/2016   BMI (body mass index), pediatric, 5% to less than 85% for age 75/09/2015   Adolescent idiopathic scoliosis of thoracolumbar region 06/29/2015   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  Visual Observations/Objective:   General Appearance: Well nourished well developed, in no apparent distress.  Eyes: conjunctiva no swelling or erythema ENT/Mouth: No hoarseness, No cough for duration of visit.  Neck: Supple  Respiratory: Respiratory effort normal, normal rate, no retractions or distress.   Cardio: Appears well-perfused, noncyanotic Musculoskeletal: no obvious deformity Skin: visible skin without rashes, ecchymosis, erythema Neuro: Awake and oriented X 3,  Psych:  normal affect, Insight and Judgment appropriate.    Assessment/Plan: 1. Vaginal discharge 2. On Depo-Provera for contraception  -DXA ordered  -reassurance that clear vaginal discharge is normal; return precautions reviewed  -daily vitamin D and calcium recommended  -referral to [facility] to establish care; advised will need labs to monitor TSH and assess Synthroid dose - if not able to get to PCP soon, will assess in our office. Last GSH 1.69 - DG DXA BODY COMPOSITION   I discussed the assessment and treatment plan with the patient and/or parent/guardian.  They were provided an opportunity to ask questions and all were answered.  They agreed with the plan and demonstrated an understanding of the instructions. They were advised to call back or seek an in-person evaluation in the emergency room if the symptoms worsen or if the condition fails to improve as anticipated.   Follow-up:   scheduled in Depo window, pending DXA    Georges Mouse, NP    CC: Klett, Pascal Lux, NP, Klett, Pascal Lux, NP

## 2022-02-08 NOTE — Progress Notes (Signed)
Patient presents for depo injection.  Patient within depo window, no urine hcg needed.  Injection given, tolerated well. F/u depo injection visit scheduled.   I am seeing her this afternoon by video. Closed for administrative purposes.

## 2022-02-10 ENCOUNTER — Encounter: Payer: Self-pay | Admitting: Family

## 2022-03-13 ENCOUNTER — Telehealth: Payer: Self-pay | Admitting: Family

## 2022-03-13 NOTE — Telephone Encounter (Signed)
Rosetta Posner Calling from [facility] for confirmation of DG DXA BODY  COMPOSITION order . Wants to confirm that that order was meant for body composition and not Bone density .  Call back number is [phone]

## 2022-03-15 ENCOUNTER — Ambulatory Visit (HOSPITAL_BASED_OUTPATIENT_CLINIC_OR_DEPARTMENT_OTHER)
Admission: RE | Admit: 2022-03-15 | Discharge: 2022-03-15 | Disposition: A | Discharge status: Home / Self Care | Payer: Medicaid Other | Source: Ambulatory Visit | Attending: Family | Admitting: Family

## 2022-03-15 DIAGNOSIS — Z3042 Encounter for surveillance of injectable contraceptive: Secondary | ICD-10-CM | POA: Diagnosis present

## 2022-04-20 ENCOUNTER — Encounter: Payer: Self-pay | Admitting: Family

## 2022-04-20 ENCOUNTER — Telehealth (INDEPENDENT_AMBULATORY_CARE_PROVIDER_SITE_OTHER): Payer: Self-pay

## 2022-04-20 ENCOUNTER — Ambulatory Visit (INDEPENDENT_AMBULATORY_CARE_PROVIDER_SITE_OTHER): Payer: Medicaid Other | Admitting: Family

## 2022-04-20 VITALS — BP 135/81 | HR 94 | Ht 60.34 in | Wt 134.6 lb

## 2022-04-20 DIAGNOSIS — K5900 Constipation, unspecified: Secondary | ICD-10-CM

## 2022-04-20 DIAGNOSIS — Z3042 Encounter for surveillance of injectable contraceptive: Secondary | ICD-10-CM

## 2022-04-20 DIAGNOSIS — R300 Dysuria: Secondary | ICD-10-CM | POA: Diagnosis not present

## 2022-04-20 DIAGNOSIS — Z113 Encounter for screening for infections with a predominantly sexual mode of transmission: Secondary | ICD-10-CM

## 2022-04-20 DIAGNOSIS — E032 Hypothyroidism due to medicaments and other exogenous substances: Secondary | ICD-10-CM

## 2022-04-20 LAB — POCT URINALYSIS DIPSTICK
Bilirubin, UA: NEGATIVE
Blood, UA: NEGATIVE
Glucose, UA: NEGATIVE
Ketones, UA: NEGATIVE
Leukocytes, UA: NEGATIVE
Nitrite, UA: NEGATIVE
Protein, UA: NEGATIVE
Spec Grav, UA: 1.01 (ref 1.010–1.025)
Urobilinogen, UA: 0.2 E.U./dL
pH, UA: 6.5 (ref 5.0–8.0)

## 2022-04-20 MED ORDER — POLYETHYLENE GLYCOL 3350 17 GM/SCOOP PO POWD: ORAL | 0 refills | Status: AC

## 2022-04-20 MED ORDER — MEDROXYPROGESTERONE ACETATE 150 MG/ML IM SUSP
150.0000 mg | Freq: Once | INTRAMUSCULAR | Status: AC
  Administered 2022-04-20: 150 mg via INTRAMUSCULAR

## 2022-04-20 MED ORDER — LEVOTHYROXINE SODIUM 50 MCG PO TABS: 50.0000 ug | ORAL_TABLET | Freq: Every day | ORAL | 5 refills | Status: DC

## 2022-04-20 NOTE — Telephone Encounter (Signed)
Called pharmacy and they did not have the patients levothyroxine. Resending Rx to pharmacy [facility] on [address] per request of mother.

## 2022-04-20 NOTE — Patient Instructions (Signed)
Linnet E Settles it was a pleasure seeing you and your family in clinic today! Here is a summary of what I would like for you to remember from your visit today:  - For your constipation - I sent a prescription for Miralax to your pharmacy.  - After you have completed your constipation cleanout, please mix 1 scoop in 8 oz of water, sugar free sports drink, or tea until you consistently have one soft bowel movement every day for 2-3 days. Then take 1/2 scoop daily for at least one to two weeks. - The goal is to help you have 1 soft stool every day - adjust the amount of Miralax you take to consistently meet this goal.  - If at any point you start to have watery stools, please cut your dose in half.  - If you start to become more constipated, please increase your scoop by 1/2 scoop up to a maximum of 1 scoop twice daily.   - If you continue to have pain with urination in 1 week, please let our office know  - The healthychildren.org website is one of my favorite health resources for parents. It is a great website developed by the [employer] that contains information about the growth and development of children, illnesses that affect children, nutrition, mental health, safety, and more. The website and articles are free, and you can sign up for their email list as well to receive their free newsletter. - You can call our clinic with any questions, concerns, or to schedule an appointment at [phone]  Sincerely,  Dr. Shawnee Knapp and [facility] and [address] [place], [place] [address] [phone]

## 2022-04-20 NOTE — Progress Notes (Signed)
History was provided by the patient and mother.  [name] is a 21 y.o. female with hypothyroidism, T2DM, eating disorder who is here for Depo, pelvic pain.  Marissa June, NP   HPI:  Pt reports abdominal pain  Per chart review, patient recently had a DEXA scan in August 2023 due to being on Depo-provera for > 2 years. DEXA scan was WNL.   [name] states that she has a lot of pain with urination for the last few days. No cloudiness to her urine. Urine does smell different than normal. Denies abnormal, thick, white discharge. Has been having cramping as well, no spotting. Cramps started a few weeks ago and occur every couple of days. Mother gives 800 mg ibuprofen once a day when she tells her mom about her cramping. She has been telling her mom that her "stomach is rumbling" for the past several. Having some diarrhea, has not had a solid bowel movement in over a week. Usually poops every other day.   Has been out of Synthroid for 2 months.   No LMP recorded. (Menstrual status: Irregular Periods).   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   Encounter for medication monitoring 04/23/2020   Major depressive disorder, recurrent episode with mixed features (HCC) 02/10/2020   Suspected autism disorder 11/25/2019   Failed vision screen 11/25/2019   Impaired functional mobility, balance, gait, and endurance 11/25/2019   Irregular periods/menstrual cycles 11/04/2019   Tachycardia 10/30/2019   Transient alteration of awareness 08/13/2019   Acne vulgaris 06/30/2019   Need for prophylactic vaccination and inoculation against influenza 05/15/2019   Self-imposed food restriction 05/15/2019   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   Verbal auditory hallucinations 05/15/2019   Encounter for  well child exam with abnormal findings 02/26/2018   Mood disorder (HCC)    Anorexia 02/20/2018   Learning disability 01/16/2018   Eating disorder    Intermittent epigastric abdominal pain 12/03/2017   Scoliosis 11/06/2016   BMI (body mass index), pediatric, 5% to less than 85% for age 42/09/2015   Adolescent idiopathic scoliosis of thoracolumbar region 06/29/2015   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 (Patient not taking: Reported on 09/26/2021)     ISOtretinoin (ACCUTANE) 30 MG capsule Take 1 capsule (30 mg total) by mouth 2 (two) times daily. (Patient not taking: Reported on 09/26/2021) 60 capsule 0   l-methylfolate-B6-B12 (METANX) 3-35-2 MG TABS tablet Take 1 tablet by mouth daily. (Patient not taking: Reported on 09/26/2021) 30 tablet 3   lamoTRIgine (LAMICTAL) 200 MG tablet Take 200 mg by mouth daily.     levothyroxine (SYNTHROID) 50 MCG tablet TAKE 1 TABLET BY MOUTH EVERY DAY 30 tablet 5   medroxyPROGESTERone (DEPO-PROVERA) 150 MG/ML injection Inject 150 mg into the muscle every 3 (three) months.      OLANZapine (ZYPREXA) 10 MG tablet Take 10 mg by mouth at bedtime.     omeprazole (PRILOSEC) 20 MG capsule TAKE 1 CAPSULE BY MOUTH TWICE A DAY 60 capsule 5   No current facility-administered medications on file prior to visit.  No Known Allergies   Physical Exam:    Vitals:   04/20/22 1020  BP: 135/81  Pulse: 94  Weight: 134 lb 9.6 oz (61.1 kg)  Height: 5' 0.34" (1.533 m)    G[employer] can only be used for patients less than 36 years old.  Physical Exam Vitals reviewed.  Constitutional:      General: She is not in acute distress.    Appearance: Normal appearance.  HENT:     Head: Normocephalic.     Right Ear: External  ear normal.     Left Ear: External ear normal.     Nose: Nose normal.     Mouth/Throat:     Mouth: Mucous membranes are moist.     Pharynx: Oropharynx is clear.  Eyes:     Extraocular Movements: Extraocular movements intact.     Conjunctiva/sclera: Conjunctivae normal.     Pupils: Pupils are equal, round, and reactive to light.  Cardiovascular:     Rate and Rhythm: Normal rate and regular rhythm.     Pulses: Normal pulses.     Heart sounds: Normal heart sounds.  Pulmonary:     Effort: Pulmonary effort is normal.     Breath sounds: Normal breath sounds.  Abdominal:     General: Abdomen is flat. Bowel sounds are normal.     Palpations: Abdomen is soft.     Tenderness: There is no abdominal tenderness.  Musculoskeletal:        General: Normal range of motion.     Cervical back: Normal range of motion and neck supple.  Lymphadenopathy:     Cervical: No cervical adenopathy.  Skin:    General: Skin is warm.     Capillary Refill: Capillary refill takes less than 2 seconds.  Neurological:     General: No focal deficit present.     Mental Status: She is alert and oriented to person, place, and time. Mental status is at baseline.  Psychiatric:        Mood and Affect: Mood normal.        Behavior: Behavior normal.        Thought Content: Thought content normal.    Results for orders placed or performed in visit on 04/20/22 (from the past 24 hour(s))  POCT urinalysis dipstick     Status: Normal   Collection Time: 04/20/22 11:09 AM  Result Value Ref Range   Color, UA yellow    Clarity, UA clear    Glucose, UA Negative Negative   Bilirubin, UA neg    Ketones, UA neg    Spec Grav, UA 1.010 1.010 - 1.025   Blood, UA neg    pH, UA 6.5 5.0 - 8.0   Protein, UA Negative Negative   Urobilinogen, UA 0.2 0.2 or 1.0 E.U./dL   Nitrite, UA neg    Leukocytes, UA Negative Negative   Appearance     Odor       Assessment/Plan:  1. Encounter for Depo-Provera contraception Provided  Depo-Provera injection today, tolerated well. - medroxyPROGESTERone (DEPO-PROVERA) injection 150 mg  2. Dysuria UA was WNL, so dysuria is most likely due to constipation rather than UTI. Declined wet prep today to check for fungal infection or BV, although patient declined having abnormal discharge that would increase my concern for either of these infections. - POCT urinalysis dipstick  3. Constipation, unspecified constipation type Patient with inconsistent stool pattern, dysuria, and hypothyroidism who has been out of her Synthroid medication for 2 months  likely has constipation. Discussed importance of a constipation cleanout followed by daily Miralax for several months to prevent future constipation. Reviewed instructions for constipation cleanout and daily Miralax, sent prescription to pharmacy.   - polyethylene glycol powder (GLYCOLAX/MIRALAX) 17 GM/SCOOP powder; For your constipation cleanout, please mix 4 capfuls of Miralax in 32 ounces of water mixed with sugar-free Gatorade, Powerade, or a similar sports drink. Give yourself a 1 hour break, then repeat this again. If you do not have clear stool output, please repeat this cleanout. Once you have completed your cleanout, please start taking 1 capful of Miralax once daily mixed in 8 ounces of water, sugar free sports drink, or tea  Dispense: 510 g; Refill: 0    [name], MD 04/20/2022 11:25 AM Pediatrics PGY-2  Supervising Provider Co-Signature  I reviewed with the resident the medical history and the resident's findings on physical examination.  I discussed with the resident the patient's diagnosis and concur with the treatment plan as documented in the resident's note.  Georges Mouse, NP

## 2022-04-21 LAB — C. TRACHOMATIS/N. GONORRHOEAE RNA
C. trachomatis RNA, TMA: NOT DETECTED
N. gonorrhoeae RNA, TMA: NOT DETECTED

## 2022-04-26 NOTE — Progress Notes (Addendum)
Subjective:20   Patient Name: [name] Date of Birth: March 02, 2001  MRN: [id number]  [name]  presents to the office today for follow up evaluation and management of her T2DM, overweight, dyspepsia, acquired autoimmune hypothyroidism, goiter, thyroiditis, elevated transaminase, family history of thyroid disease, major depression, suspected autism, selective mutism, and PTSD, in the setting of previous weight loss due to h. Pylorii gastritis.  HISTORY OF PRESENT ILLNESS:   [name] is a 21 y.o. Caucasian young woman.  [name] was accompanied by her mother.  1. [name] had her initial pediatric endocrine consultation on 06/22/21:  A. [name] presents for follow up evaluation and treatment of rapid weight gain and elevated glucose, morbid obesity, iron deficiency, .    1). Present illness: A). Height was at the 4.85% at age 56, increased to the 9.59% at age 21, and has plateaued at the 4.67%.    B). Weight was below the 3% through age 66, increased to the 2.85% at age 31, decreased to the <0.01% from age 20-19, and had since then increased dramatically to the 73% at age 21.     C). BMI curve followed the weight curve. Her most recent BMI was at the 90.97%.    D). [name] was diagnosed with major depressive disorder at about age [age] in freshman year in high school. She lost a large amount of weight at that time. She is followed at [facility]. [name] started [employer] in about 2019-2020. She was in [facility] from August 2019 to October 2020 for treatment for eating disorder and depression. In November 2020 her weight had increased to the 6.62%. [name] started Zyprexa shortly after her discharge from [facility], probably in early 2021.      E). [name] was then followed at the [facility]. Unfortunately, [name]'s weight later decreased to the <0.01% in October 2021.     F). [name] was diagnosed with h. Pylorii gastritis in November 2021 and was  treated with antibiotics. Thereafter her appetite improved and she started gaining weight.  Her weight then increased 52 pounds in the following year.     G). In retrospect, [name] had also been eating a lot of carbs and desserts and drinking a lot of regular sodas, to include Dr. Reino Kent, and sweet juices.     H). On 06/17/21 [name] was seen by Dr. Barney [name] for evaluation and treatment of dysuria. Urinalysis showed 4+ glucose, 2+ hemoglobin, 2+ leukocyte esterase, negative for ketones, negative nor nitrites, and trace for protein and urobilinogen. CBG was 269. She was then referred to me.   1). Medical problems:       A). Idiopathic thoracolumbar scoliosis    B). Hypothyroidism, primary, acquired: Diagnosed in mid-2022    C). Acne, treated with Accutane    D). Genetic defect: paternally derived FLG abnormality, other VUS     E). Major depressive disorder: As noted above    F). Suspected autism disorder    G). Selective mutism as an adjustment disorder    H). Anorexia, self-imposed food restriction, severe protein-calorie malnutrition    I). ADHD, anxiety, PTSD (Mother does not know why.), hallucinations, intellectual disability    J). Tachycardia:     K). Activities: Sedentary, except for walking her dog     2). Surgeries: PE tubes; Scoliosis surgery 2018, two rods placed 01/18/20; repair of left eyelid ptosis   3). Allergies: No known medication allergies   4). GYN: Menarche occurred at age 49-15. Menses have always been  irregular. She has been on depo-Provera since about 2021.   5). Medications: Nexium, 40 mg.day; levothyroxine 50 mcg/day; olanzapine, 15 mg at bedtime; l-methylfolate, one tablet daily; lamictal 200 mg  C. Pertinent family history:   1). Obesity: Mother, maternal grandmother, brother   2). DM: Maternal grandmother, mother, and maternal aunt have T2DM.    2). Thyroid disease: Maternal grandmother and maternal aunt have underactive thyroid glands. Both ladies had Graves'  disease.   3). ASCVD: Maternal grandfather had a stroke. Maternal aunt may have had a stroke.    4). Cancers: Paternal grandfather   5). Others: Maternal grandmother has hypertension and asthma. Maternal grandfather has hyperlipidemia. Dad is an alcoholic. Paternal grandfather had bad arthritis with gnarly hands. Brother has active carpal tunnel disease.   2. [name]'s last Endocrine Clinic visit occurred on 09/26/21. At that visit I continued omeprazole, 20 mg, twice daily.  A. In the interim she has been healthy.   B. [name]'s voracious appetite has decreased. She is not eating as much. The omeprazole is working better than Nexium did.   C. She is still tired. Her energy is fairly low. She is always cold.  D. She is sleeping well. Melatonin helps.   E. She had difficulty obtaining levothyroxine for several months, but resumed taking it recently. She remains on olanzapine and lamictal   3. Pertinent Review of Systems:  Constitutional: The patient feels "good.  She does not have any significant complaints. Eyes: Vision is fairly good with her glasses. There are no other significant eye complaints. Neck: The patient has no complaints of anterior neck swelling, soreness, tenderness,  pressure, discomfort, or difficulty swallowing.  Heart: Heart rate increases with exercise or other physical activity. The patient has no complaints of palpitations, irregular heat beats, chest pain, or chest pressure. Gastrointestinal: She has more belly hunger. Bowel movents seem normal. The patient has no complaints of excessive hunger, acid reflux, upset stomach, stomach aches or pains, diarrhea, or constipation. Hands: She no longer has pains in her palms and wrists. Legs: Muscle mass and strength seem normal. There are no complaints of numbness, tingling, burning, or pain. No edema is noted. Feet: She sometimes has pains in the right mid-foot. There are no other complaints of numbness, tingling, burning, or pain. No  edema is noted. GYN: [facility] She still sometimes has breakthrough bleeding and cramps.    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 pylori infection 04/01/2020   Poor fetal growth    Ptosis    PTSD (post-traumatic stress disorder)    Scoliosis    Severe protein-calorie malnutrition [name](Gomez: less than 60% of standard weight) (HCC)     Family History  Problem Relation Age of Onset   Hypertension Maternal Grandmother    Diabetes Maternal Grandmother    Asthma Maternal Grandmother    Cancer Maternal Grandfather        blood   Diabetes Maternal Grandfather    Hypertension Maternal Grandfather  Heart disease Maternal Grandfather    Hyperlipidemia Maternal Grandfather    Cancer Paternal Grandfather        Lymphoma   Alcohol abuse Neg Hx    Arthritis Neg Hx    Birth defects Neg Hx    COPD Neg Hx    Depression Neg Hx    Drug abuse Neg Hx    Early death Neg Hx    Hearing loss Neg Hx    Kidney disease Neg Hx    Learning disabilities Neg Hx    Mental illness Neg Hx    Mental retardation Neg Hx    Miscarriages / Stillbirths Neg Hx    Stroke Neg Hx    Vision loss Neg Hx    Varicose Veins Neg Hx    Colon cancer Neg Hx    Esophageal cancer Neg Hx    Rectal cancer Neg Hx    Stomach cancer Neg Hx      Current Outpatient Medications:    lamoTRIgine (LAMICTAL) 200 MG tablet, Take 200 mg by mouth daily., Disp: , Rfl:    levothyroxine (SYNTHROID) 50 MCG tablet, Take 1 tablet (50 mcg total) by mouth  daily., Disp: 30 tablet, Rfl: 5   Ginkgo Biloba 40 MG TABS, Take by mouth. (Patient not taking: Reported on 09/26/2021), Disp: , Rfl:    ISOtretinoin (ACCUTANE) 30 MG capsule, Take 1 capsule (30 mg total) by mouth 2 (two) times daily. (Patient not taking: Reported on 09/26/2021), Disp: 60 capsule, Rfl: 0   l-methylfolate-B6-B12 (METANX) 3-35-2 MG TABS tablet, Take 1 tablet by mouth daily. (Patient not taking: Reported on 09/26/2021), Disp: 30 tablet, Rfl: 3   medroxyPROGESTERone (DEPO-PROVERA) 150 MG/ML injection, Inject 150 mg into the muscle every 3 (three) months.  (Patient not taking: Reported on 04/27/2022), Disp: , Rfl:    OLANZapine (ZYPREXA) 10 MG tablet, Take 10 mg by mouth at bedtime. (Patient not taking: Reported on 04/27/2022), Disp: , Rfl:    omeprazole (PRILOSEC) 20 MG capsule, TAKE 1 CAPSULE BY MOUTH TWICE A DAY (Patient not taking: Reported on 04/27/2022), Disp: 60 capsule, Rfl: 5   polyethylene glycol powder (GLYCOLAX/MIRALAX) 17 GM/SCOOP powder, For your constipation cleanout, please mix 4 capfuls of Miralax in 32 ounces of water mixed with sugar-free Gatorade, Powerade, or a similar sports drink. Give yourself a 1 hour break, then repeat this again. If you do not have clear stool output, please repeat this cleanout. Once you have completed your cleanout, please start taking 1 capful of Miralax once daily mixed in 8 ounces of water, sugar free sports drink, or tea (Patient not taking: Reported on 04/27/2022), Disp: 510 g, Rfl: 0  Allergies as of 04/27/2022   (No Known Allergies)    1. Work and Family: Lives with mother and brother. Father is not in the home. Finished 10th grade. She goes to a day school program from 10:00 AM to 3:00 PM.  2. Activities: Sedentary, stays at home for the most part, but is walking more with the warmer weather.  3. Smoking, alcohol, or drugs: None 4. Primary Care Provider: No primary care provider on file. I recommended Dr. Fabian Sharp, but she was not accepting new  patients. Marland Kitchen   REVIEW OF SYSTEMS: There are no other significant problems involving Milani's other body systems.   Objective:  Vital Signs:  BP 118/72   Pulse 96   Wt 135 lb 3.2 oz (61.3 kg)   BMI 26.11 kg/m    Ht Readings from Last 3 Encounters:  04/20/22 5' 0.34" (1.533 m)  02/08/22  (1.549 m)  09/15/21 5' 0.5" (1.537 m)   Wt Readings from Last 3 Encounters:  04/27/22 135 lb 3.2 oz (61.3 kg)  04/20/22 134 lb 9.6 oz (61.1 kg)  02/08/22 139 lb (63 kg)   HC Readings from Last 3 Encounters:  10/07/20 20.55" (52.2 cm)  05/06/20 20.08" (51 cm)   Body surface area is 1.62 meters squared.  Facility age limit for growth %iles is 20 years. Facility age limit for growth %iles is 20 years.   PHYSICAL EXAM:  Constitutional: The patient appears healthy and much less overweight. Her height has plateaued. Her weight has decreased 10 pounds. Her BMI is 26.11 kg/m2. She sat passively in her chair with her hands folded. She answered my questions with one-word answers. Her affect was flat, but friendly. Her insight seemed limited. I was able to get her to laugh several times.  Face: The face appears normal.  Eyes: There is no obvious arcus or proptosis. Moisture appears normal. Mouth: The oropharynx and tongue appear normal. Oral moisture is normal. Neck: The neck appears to be visibly normal. No carotid bruits are noted. The thyroid gland has shrunk back to top-normal size of 20 grams in size. The consistency of the thyroid gland is normal. The thyroid gland is not tender to palpation. Lungs: The lungs are clear to auscultation. Air movement is good. Heart: Heart rate and rhythm are regular. Heart sounds S1 and S2 are normal. I did not appreciate any pathologic cardiac murmurs. Abdomen: The abdomen appears to be overweight, but less so. Bowel sounds are normal. There is no obvious hepatomegaly, splenomegaly, or other mass effect.  Arms: Muscle size and bulk are normal for age. Hands:  There is no hand tremor. Phalangeal and metacarpophalangeal joints are normal. Palmar muscles are normal. Palmar skin is normal. Palmar moisture is also normal. She has pallor of several nail beds.  Legs: Muscles appear normal for age. No edema is present. Neurologic: Strength is normal for age in both the upper and lower extremities. Muscle tone is normal. Sensation to touch is normal in both legs.    LAB DATA:  Results for orders placed or performed in visit on 04/27/22 (from the past 504 hour(s))  POCT Glucose (Device for Home Use)   Collection Time: 04/27/22  9:29 AM  Result Value Ref Range   Glucose Fasting, POC     POC Glucose 85 70 - 99 mg/dl  POCT glycosylated hemoglobin (Hb A1C)   Collection Time: 04/27/22  9:34 AM  Result Value Ref Range   Hemoglobin A1C 5.1 4.0 - 5.6 %   HbA1c POC (<> result, manual entry)     HbA1c, POC (prediabetic range)     HbA1c, POC (controlled diabetic range)    Results for orders placed or performed in visit on 04/20/22 (from the past 504 hour(s))  POCT urinalysis dipstick   Collection Time: 04/20/22 11:09 AM  Result Value Ref Range   Color, UA yellow    Clarity, UA clear    Glucose, UA Negative Negative   Bilirubin, UA neg    Ketones, UA neg    Spec Grav, UA 1.010 1.010 - 1.025   Blood, UA neg    pH, UA 6.5 5.0 - 8.0   Protein, UA Negative Negative   Urobilinogen, UA 0.2 0.2 or 1.0 E.U./dL   Nitrite, UA neg    Leukocytes, UA Negative Negative   Appearance     Odor  C. trachomatis/N. gonorrhoeae RNA   Collection Time: 04/20/22  4:11 PM   Specimen: Urine, Voided  Result Value Ref Range   C. trachomatis RNA, TMA NOT DETECTED NOT DETECTED   N. gonorrhoeae RNA, TMA NOT DETECTED NOT DETECTED   Labs 04/27/22: HbA1c 5.1%, CBG 85: TSH 2.17, free T4 1.2, free T3 3.8; CMP normal; CBC abnormal, with RBC 5.50 (ref 3.8-5.1), MCV 79.8 (ref 80-100), and MCH 25.6 (ref 27-33); iron 42 (ref 40-190)  Labs 09/26/21: HbA1c 5.5%; TSH 1.13, free T4 1.4, free  T3 3.5; CBC abnormal, with RBC 5.81 (ref 3.80-5.10), MCV 77.3 (ref 80-100), MCH 24.1 (ref 27-33), and MCHC 31.2 (ref 32-36); iron 44 (ref 40-190)  Labs 06/22/21: HbA1c 6.8%, CBG 126; C-peptide 7.38 (ref 0.80-3.85), TPO antibody 3 (ref <9), thyroglobulin antibody 3 (ref < or = 1)  Lab 06/17/21: HbA1c 7.1%; TSH 1.69, free T4 1.2, free T3 3.8; CMP normal, except glucose 104 and  AST 34 (ref 12-32); CBC abnormal, with RBC 5.45 (ref 3.80-5.10), MCV 77.6 (ref 80-100), MCH 24.8 (ref 27-33), MCHC 31.9 (ref 32-36); Urinalysis: glucose positive, ketones negative   Lab 06/14/21: Urine pregnancy test negative   Assessment and Plan:   ASSESSMENT: 1. Diet-controlled T2DM:  A. Aili has the personal history of being overweight and having new-onset T2DM. She also has the family history of obesity and T2DM.   B. Since her dyspepsia was stimulating her appetite excessively, she was a good candidate for treatment with omeprazole, with our Eat Right Diet plan, and with increasing her physical activity.   C. During the past 10 months Mafalda has not had as much belly hunger. She has been more careful with her eating, and walks at times. Her HbA1c has decreased into the middle of the normal range.  2. Overweight, with rapid weight gain: The patient's overly fat adipose cells produce excessive amount of cytokines that both directly and indirectly cause serious health problems.   A. Some cytokines cause hypertension. Other cytokines cause inflammation within arterial walls. Still other cytokines contribute to dyslipidemia. Yet other cytokines cause resistance to insulin and compensatory hyperinsulinemia.  B. The hyperinsulinemia, in turn, causes acquired acanthosis nigricans and  excess gastric acid production resulting in dyspepsia (excess belly hunger, upset stomach, and often stomach pains).   C. Hyperinsulinemia in children causes more rapid linear growth than usual. The combination of tall child and heavy body  stimulates the onset of central precocity in ways that we still do not understand. The final adult height is often much reduced.  D. Hyperinsulinemia in women also stimulates excess production of testosterone by the ovaries and both androstenedione and DHEA by the adrenal glands, resulting in hirsutism, irregular menses, secondary amenorrhea, and infertility. This symptom complex is commonly called Polycystic Ovarian Syndrome, but many endocrinologists still prefer the diagnostic label of the Stein-leventhal Syndrome.  E. When the insulin resistance overwhelms the ability of the pancreatic beta cells to produce ever increasing amounts of insulin, glucose intolerance ensues. Initially the patients develop pre-diabetes. Unfortunately, unless the patient make the lifestyle changes that are needed to lose fat weight, they will usually progress to frank T2DM. Sarayah has done so.  F. In November 2022, Jonai had a C-peptide value that was almost twice the upper limit of the normal range, but was not enough insulin to control her BGs at her previous levels of insulin resistance and sugar intake.  G. Since her last visit in February 2023, Yaremi has lost 10 pounds. She is trying to Eat  Right and exercise more. At this time, she does not need either metformin of a GLP-1 agent. However, if she does not continue to make the lifestyle changes she needs to, she might need to start metformin or a GLP-1 agent in the future.  3. Hypothyroid, acquired, autoimmune:   A. Cherine has a family history of what sounds like autoimmune hyperthyroidism and hypothyroidism.  B. Since Sinaya did not have any thyroid surgery or irradiation herself, and since she was never placed on a prolonged low-iodine diet, it is very likely that Hashimoto's disease was the cause of her acquired hypothyroidism.   C.Her TFTs in November 2022 were euthyroid on her current levothyroxine dose of 50 mcg/day.   D. Her elevated thyroglobulin antibody confirmed  the clinical suspicion of Hashimoto's disease.  E. Given the fact that she did not have levothyroxine for 2-3 months, it is very easy to understand why her thyroid tests overall are normal, but her TSH is higher and her free T4 is lower today.   4. Elevated transaminase:  A. Her AST in February 2-23 was mildly elevated, c/w her obesity.  B. Since losing fat weight, her AST has normalized in September 2023.   5. Abnormal RBC indices/low iron: A. Her iron level of 42 in September 2023 was even lower than her value of 44 in February 2023. Both values were within the reference range, but were really low. The reference range is a statistical range that includes many women with iron deficiency and iron-deficiency anemia, so is actually a skewed distribution, not a normal distribution. Most hematologists I've consulted use a value of 70 as the true lower limit of normal range. B. I suspect that her low iron level is part of the cause of her fatigue, being cold, having nail pallor, and causing her abnormal RBC  indices. She should be taking iron every day.  6. Dyspepsia: As above. She has done well with omeprazole thus far.  7. Family history thyroid disease: As above 8. Fatigue, other: As above. Gaynell has been without levothyroxine for 2-3 months and just resumed taking it recently. I suspect that part of the cause of her fatigue and cold feeling is due to hypothyroidism and prt is due to iron deficiency.   PLAN:  1. Diagnostic: HbA1c, CBC, TFTs, CBC, and iron today 2. Therapeutic: Continue omeprazole 20 mg, twice a day. Eat Right Diet. Walk for an hour a day. Consider adding metformin or a GLP-1 agent. Start ferrous sulfate, 324 mg, once daily.  3. Patient education: We discussed all of the above at great length, to include discussing Hashimoto's disease. .  4. Follow-up: Family needs to find a good adult internal medicine specialist who can manage her previous elevated glucose levels, her overweight, and  her acquired hypothyroidism. I suggested Dr. Gayla Medicus as an internist and Dr. Talmage Nap as an endocrinologist as needed.   Level of Service: This visit lasted in excess of 60 minutes. More than 50% of the visit was devoted to counseling.  Molli Knock, MD, [facility]

## 2022-04-27 ENCOUNTER — Ambulatory Visit (INDEPENDENT_AMBULATORY_CARE_PROVIDER_SITE_OTHER): Payer: Medicaid Other | Admitting: "Endocrinology

## 2022-04-27 ENCOUNTER — Ambulatory Visit: Payer: Medicaid Other | Admitting: Family

## 2022-04-27 ENCOUNTER — Encounter (INDEPENDENT_AMBULATORY_CARE_PROVIDER_SITE_OTHER): Payer: Self-pay | Admitting: "Endocrinology

## 2022-04-27 VITALS — BP 118/72 | HR 96 | Wt 135.2 lb

## 2022-04-27 DIAGNOSIS — R7401 Elevation of levels of liver transaminase levels: Secondary | ICD-10-CM

## 2022-04-27 DIAGNOSIS — E119 Type 2 diabetes mellitus without complications: Secondary | ICD-10-CM

## 2022-04-27 DIAGNOSIS — E049 Nontoxic goiter, unspecified: Secondary | ICD-10-CM

## 2022-04-27 DIAGNOSIS — R5383 Other fatigue: Secondary | ICD-10-CM

## 2022-04-27 DIAGNOSIS — E611 Iron deficiency: Secondary | ICD-10-CM

## 2022-04-27 DIAGNOSIS — R718 Other abnormality of red blood cells: Secondary | ICD-10-CM

## 2022-04-27 DIAGNOSIS — E063 Autoimmune thyroiditis: Secondary | ICD-10-CM | POA: Diagnosis not present

## 2022-04-27 DIAGNOSIS — E663 Overweight: Secondary | ICD-10-CM | POA: Diagnosis not present

## 2022-04-27 DIAGNOSIS — R1013 Epigastric pain: Secondary | ICD-10-CM

## 2022-04-27 LAB — POCT GLYCOSYLATED HEMOGLOBIN (HGB A1C): Hemoglobin A1C: 5.1 % (ref 4.0–5.6)

## 2022-04-27 LAB — POCT GLUCOSE (DEVICE FOR HOME USE): POC Glucose: 85 mg/dl (ref 70–99)

## 2022-04-27 NOTE — Patient Instructions (Signed)
No further follow up here. I suggested that the family obtain anew primary care provider for [name]. I suggested that they look into seeing Dr. Garnet Koyanagi at [phone] and Dr. Chalmers Cater if needed for endocrinology.   At [facility], we are committed to providing exceptional care. You will receive a patient satisfaction survey through text or email regarding your visit today. Your opinion is important to me. Comments are appreciated.

## 2022-04-28 LAB — CBC WITH DIFFERENTIAL/PLATELET
Absolute Monocytes: 396 cells/uL (ref 200–950)
Basophils Absolute: 62 cells/uL (ref 0–200)
Basophils Relative: 0.7 %
Eosinophils Absolute: 194 cells/uL (ref 15–500)
Eosinophils Relative: 2.2 %
HCT: 43.9 % (ref 35.0–45.0)
Hemoglobin: 14.1 g/dL (ref 11.7–15.5)
Lymphs Abs: 2948 cells/uL (ref 850–3900)
MCH: 25.6 pg — ABNORMAL LOW (ref 27.0–33.0)
MCHC: 32.1 g/dL (ref 32.0–36.0)
MCV: 79.8 fL — ABNORMAL LOW (ref 80.0–100.0)
MPV: 11.1 fL (ref 7.5–12.5)
Monocytes Relative: 4.5 %
Neutro Abs: 5201 cells/uL (ref 1500–7800)
Neutrophils Relative %: 59.1 %
Platelets: 257 10*3/uL (ref 140–400)
RBC: 5.5 10*6/uL — ABNORMAL HIGH (ref 3.80–5.10)
RDW: 14.5 % (ref 11.0–15.0)
Total Lymphocyte: 33.5 %
WBC: 8.8 10*3/uL (ref 3.8–10.8)

## 2022-04-28 LAB — COMPREHENSIVE METABOLIC PANEL
AG Ratio: 1.7 (calc) (ref 1.0–2.5)
ALT: 16 U/L (ref 6–29)
AST: 19 U/L (ref 10–30)
Albumin: 4.5 g/dL (ref 3.6–5.1)
Alkaline phosphatase (APISO): 85 U/L (ref 31–125)
BUN: 11 mg/dL (ref 7–25)
CO2: 24 mmol/L (ref 20–32)
Calcium: 9.9 mg/dL (ref 8.6–10.2)
Chloride: 105 mmol/L (ref 98–110)
Creat: 0.91 mg/dL (ref 0.50–0.96)
Globulin: 2.7 g/dL (calc) (ref 1.9–3.7)
Glucose, Bld: 82 mg/dL (ref 65–139)
Potassium: 4.4 mmol/L (ref 3.5–5.3)
Sodium: 140 mmol/L (ref 135–146)
Total Bilirubin: 0.3 mg/dL (ref 0.2–1.2)
Total Protein: 7.2 g/dL (ref 6.1–8.1)

## 2022-04-28 LAB — T4, FREE: Free T4: 1.2 ng/dL (ref 0.8–1.4)

## 2022-04-28 LAB — T3, FREE: T3, Free: 3.8 pg/mL (ref 3.0–4.7)

## 2022-04-28 LAB — TSH: TSH: 2.17 mIU/L

## 2022-04-28 LAB — IRON: Iron: 42 ug/dL (ref 40–190)

## 2022-05-11 MED ORDER — FERROUS SULFATE 324 (65 FE) MG PO TBEC: DELAYED_RELEASE_TABLET | ORAL | 3 refills | Status: AC

## 2022-05-11 NOTE — Addendum Note (Signed)
Addended by: Sherrlyn Hock on: 05/11/2022 12:53 PM   Modules accepted: Orders

## 2022-05-31 ENCOUNTER — Encounter: Payer: Self-pay | Admitting: Family Medicine

## 2022-05-31 ENCOUNTER — Ambulatory Visit (INDEPENDENT_AMBULATORY_CARE_PROVIDER_SITE_OTHER): Payer: Medicaid Other | Admitting: Family Medicine

## 2022-05-31 VITALS — BP 102/74 | HR 104 | Temp 97.7°F | Resp 16 | Ht 60.5 in | Wt 138.0 lb

## 2022-05-31 DIAGNOSIS — E032 Hypothyroidism due to medicaments and other exogenous substances: Secondary | ICD-10-CM

## 2022-05-31 DIAGNOSIS — E119 Type 2 diabetes mellitus without complications: Secondary | ICD-10-CM

## 2022-05-31 DIAGNOSIS — K219 Gastro-esophageal reflux disease without esophagitis: Secondary | ICD-10-CM | POA: Diagnosis not present

## 2022-05-31 DIAGNOSIS — Z7689 Persons encountering health services in other specified circumstances: Secondary | ICD-10-CM | POA: Diagnosis not present

## 2022-05-31 DIAGNOSIS — E1165 Type 2 diabetes mellitus with hyperglycemia: Secondary | ICD-10-CM

## 2022-05-31 MED ORDER — LEVOTHYROXINE SODIUM 50 MCG PO TABS: 50.0000 ug | ORAL_TABLET | Freq: Every day | ORAL | 5 refills | Status: DC

## 2022-05-31 MED ORDER — OMEPRAZOLE 20 MG PO CPDR: 20.0000 mg | DELAYED_RELEASE_CAPSULE | Freq: Two times a day (BID) | ORAL | 5 refills | Status: DC

## 2022-06-01 ENCOUNTER — Encounter: Payer: Self-pay | Admitting: Family Medicine

## 2022-06-01 NOTE — Progress Notes (Signed)
New Patient Office Visit  Subjective    Patient ID: [name], female    DOB: 2001/02/12  Age: 21 y.o. MRN: [id number]  CC:  Chief Complaint  Patient presents with   Establish Care    HPI [name] presents to establish care and for review of chronic med issues. Patient denies acute complaints.    Outpatient Encounter Medications as of 05/31/2022  Medication Sig   ferrous sulfate 324 (65 Fe) MG TBEC Take one tablet daily.   l-methylfolate-B6-B12 (METANX) 3-35-2 MG TABS tablet Take 1 tablet by mouth daily.   lamoTRIgine (LAMICTAL) 200 MG tablet Take 200 mg by mouth daily.   medroxyPROGESTERone (DEPO-PROVERA) 150 MG/ML injection Inject 150 mg into the muscle every 3 (three) months.   OLANZapine (ZYPREXA) 10 MG tablet Take 10 mg by mouth at bedtime.   polyethylene glycol powder (GLYCOLAX/MIRALAX) 17 GM/SCOOP powder For your constipation cleanout, please mix 4 capfuls of Miralax in 32 ounces of water mixed with sugar-free Gatorade, Powerade, or a similar sports drink. Give yourself a 1 hour break, then repeat this again. If you do not have clear stool output, please repeat this cleanout. Once you have completed your cleanout, please start taking 1 capful of Miralax once daily mixed in 8 ounces of water, sugar free sports drink, or tea   [DISCONTINUED] levothyroxine (SYNTHROID) 50 MCG tablet Take 1 tablet (50 mcg total) by mouth daily.   [DISCONTINUED] omeprazole (PRILOSEC) 20 MG capsule TAKE 1 CAPSULE BY MOUTH TWICE A DAY   Ginkgo Biloba 40 MG TABS Take by mouth. (Patient not taking: Reported on 05/31/2022)   ISOtretinoin (ACCUTANE) 30 MG capsule Take 1 capsule (30 mg total) by mouth 2 (two) times daily. (Patient not taking: Reported on 09/26/2021)   levothyroxine (SYNTHROID) 50 MCG tablet Take 1 tablet (50 mcg total) by mouth daily.   omeprazole (PRILOSEC) 20 MG capsule Take 1 capsule (20 mg total) by mouth 2 (two) times daily.   No facility-administered encounter  medications on file as of 05/31/2022.    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 pylori infection 04/01/2020   Poor fetal growth    Ptosis    PTSD (post-traumatic stress disorder)    Scoliosis    Severe protein-calorie malnutrition Altamease Oiler: less than 60% of standard weight) ([place])     Past Surgical History:  Procedure Laterality Date   BELPHAROPTOSIS REPAIR Left    Dr. Gevena Cotton   EYE SURGERY N/A    Phreesia 07/19/2020   scoliosis surgery  05/2017   [facility]   SPINE SURGERY N/A    Phreesia 12/30/2019   TYMPANOSTOMY TUBE PLACEMENT     1 1/20 yrs old    Family History  Problem Relation Age of Onset   Hypertension Maternal Grandmother    Diabetes Maternal Grandmother    Asthma Maternal Grandmother    Cancer Maternal Grandfather        blood   Diabetes Maternal Grandfather    Hypertension Maternal Grandfather    Heart disease Maternal  Grandfather    Hyperlipidemia Maternal Grandfather    Cancer Paternal Grandfather        Lymphoma   Alcohol abuse Neg Hx    Arthritis Neg Hx    Birth defects Neg Hx    COPD Neg Hx    Depression Neg Hx    Drug abuse Neg Hx    Early death Neg Hx    Hearing loss Neg Hx    Kidney disease Neg Hx    Learning disabilities Neg Hx    Mental illness Neg Hx    Mental retardation Neg Hx    Miscarriages / Stillbirths Neg Hx    Stroke Neg Hx    Vision loss Neg Hx    Varicose Veins Neg Hx    Colon cancer Neg Hx    Esophageal cancer Neg Hx    Rectal cancer Neg Hx    Stomach cancer Neg Hx     Social History   Socioeconomic  History   Marital status: Single    Spouse name: Not on file   Number of children: 0   Years of education: Not on file   Highest education level: Not on file  Occupational History   Not on file  Tobacco Use   Smoking status: Never    Passive exposure: Yes   Smokeless tobacco: Never   Tobacco comments:    Mom and dad smoke in home  Vaping Use   Vaping Use: Never used  Substance and Sexual Activity   Alcohol use: No    Alcohol/week: 0.0 standard drinks of alcohol   Drug use: No   Sexual activity: Never    Birth control/protection: Abstinence  Other Topics Concern   Not on file  Social History Narrative   11/11/19 Lives with mom and brother. Dad no longer in the home. Cat and dog in household. Day school program. Last offically finished school 10th grade.   Social Determinants of Health   Financial Resource Strain: Not on file  Food Insecurity: Not on file  Transportation Needs: Not on file  Physical Activity: Not on file  Stress: Not on file  Social Connections: Not on file  Intimate Partner Violence: Not on file    Review of Systems  All other systems reviewed and are negative.       Objective    BP 102/74   Pulse (!) 104   Temp 97.7 F (36.5 C) (Oral)   Resp 16   Ht 5' 0.5" (1.537 m)   Wt 138 lb (62.6 kg)   SpO2 97%   BMI 26.51 kg/m   Physical Exam Vitals and nursing note reviewed.  Constitutional:      General: She is not in acute distress. Neck:     Thyroid: No thyromegaly.  Cardiovascular:     Rate and Rhythm: Normal rate and regular rhythm.  Pulmonary:     Effort: Pulmonary effort is normal.     Breath sounds: Normal breath sounds.  Abdominal:     Palpations: Abdomen is soft.     Tenderness: There is no abdominal tenderness.  Musculoskeletal:     Cervical back: Normal range of motion and neck supple.  Neurological:     General: No focal deficit present.     Mental Status: She is alert and oriented to person, place, and time.          Assessment & Plan:   1. Type 2 diabetes mellitus without complication, without long-term current use of insulin ([place]) Recent  A1c is at goal. Continue present management (without meds) - Microalbumin / creatinine urine ratio  2. Hypothyroidism due to medicaments and other exogenous substances Appears stable. Meds refilled.  - levothyroxine (SYNTHROID) 50 MCG tablet; Take 1 tablet (50 mcg total) by mouth daily.  Dispense: 30 tablet; Refill: 5  3. Gastroesophageal reflux disease without esophagitis Meds refilled.   4. Encounter to establish care     Return in about 3 months (around 08/31/2022) for follow up.   Becky Sax, MD

## 2022-06-02 LAB — MICROALBUMIN / CREATININE URINE RATIO
Creatinine, Urine: 82.3 mg/dL
Microalb/Creat Ratio: 44 mg/g creat — ABNORMAL HIGH (ref 0–29)
Microalbumin, Urine: 36 ug/mL

## 2022-07-06 ENCOUNTER — Ambulatory Visit: Payer: Medicaid Other | Admitting: Family

## 2022-07-07 ENCOUNTER — Ambulatory Visit (INDEPENDENT_AMBULATORY_CARE_PROVIDER_SITE_OTHER): Payer: Medicaid Other

## 2022-07-07 DIAGNOSIS — Z3042 Encounter for surveillance of injectable contraceptive: Secondary | ICD-10-CM | POA: Diagnosis not present

## 2022-07-07 DIAGNOSIS — Z30013 Encounter for initial prescription of injectable contraceptive: Secondary | ICD-10-CM

## 2022-07-07 MED ORDER — MEDROXYPROGESTERONE ACETATE 150 MG/ML IM SUSP
150.0000 mg | Freq: Once | INTRAMUSCULAR | Status: AC
  Administered 2022-07-07: 150 mg via INTRAMUSCULAR

## 2022-07-07 NOTE — Progress Notes (Signed)
Depo-provera administered right deltoid 

## 2022-07-17 ENCOUNTER — Ambulatory Visit: Payer: Self-pay | Admitting: Family Medicine

## 2022-09-29 ENCOUNTER — Ambulatory Visit (INDEPENDENT_AMBULATORY_CARE_PROVIDER_SITE_OTHER): Payer: Medicaid Other

## 2022-09-29 ENCOUNTER — Ambulatory Visit: Payer: Self-pay

## 2022-09-29 ENCOUNTER — Ambulatory Visit
Admission: EM | Admit: 2022-09-29 | Discharge: 2022-09-29 | Disposition: A | Discharge status: Home / Self Care | Payer: Medicaid Other | Attending: Nurse Practitioner | Admitting: Nurse Practitioner

## 2022-09-29 DIAGNOSIS — R1033 Periumbilical pain: Secondary | ICD-10-CM | POA: Diagnosis not present

## 2022-09-29 DIAGNOSIS — R109 Unspecified abdominal pain: Secondary | ICD-10-CM

## 2022-09-29 DIAGNOSIS — G8929 Other chronic pain: Secondary | ICD-10-CM

## 2022-09-29 LAB — POCT URINALYSIS DIP (MANUAL ENTRY)
Bilirubin, UA: NEGATIVE
Blood, UA: NEGATIVE
Glucose, UA: NEGATIVE mg/dL
Leukocytes, UA: NEGATIVE
Nitrite, UA: NEGATIVE
Protein Ur, POC: 30 mg/dL — AB
Spec Grav, UA: >=1.03 — AB (ref 1.010–1.025)
Urobilinogen, UA: 0.2 E.U./dL
pH, UA: 6 (ref 5.0–8.0)

## 2022-09-29 NOTE — ED Provider Notes (Signed)
[name]    CSN: [id number] Arrival date & time: 09/29/22  1153      History   Chief Complaint No chief complaint on file.   HPI [name] is a 22 y.o. female.   HPI  She is in today with her mother for abdominal pain. She reports that for 4 days she has been having pain. Her mother endorses constipation. She did add a week of stool softener to her regimen. She endorses not drinking water. She does occasionally use a carbonated water. Denies headache, dizziness, visual changes, shortness of breath, dyspnea on exertion, chest pain, nausea, vomiting or any edema.    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/14/2020   Encounter for medication monitoring 04/23/2020   Major depressive disorder, recurrent episode with mixed features ([place]) 02/10/2020   Suspected autism disorder 11/25/2019   Failed vision screen 11/25/2019   Impaired functional mobility, balance, gait, and endurance 11/25/2019   Irregular periods/menstrual cycles 11/04/2019   Tachycardia 10/30/2019   Transient alteration of awareness 08/13/2019   Acne vulgaris 06/30/2019    Need for prophylactic vaccination and inoculation against influenza 05/15/2019   Self-imposed food restriction 05/15/2019   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   Verbal auditory hallucinations 05/15/2019   Encounter for well child exam with abnormal findings 02/26/2018   Mood disorder ([place])    Anorexia 02/20/2018   Learning disability 01/16/2018   Eating disorder    Intermittent epigastric abdominal pain 12/03/2017   Scoliosis 11/06/2016   BMI (body mass index), pediatric, 5% to less than 85% for age 75/09/2015   Adolescent idiopathic scoliosis of thoracolumbar region 06/29/2015   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 Gevena Cotton   EYE SURGERY N/A    Phreesia 07/19/2020   scoliosis surgery  05/2017   [facility]   SPINE SURGERY N/A    Phreesia 12/30/2019   TYMPANOSTOMY TUBE PLACEMENT     1 1/22 yrs old    OB History   No obstetric history on file.      Home Medications    Prior to Admission medications   Medication Sig Start Date End Date Taking? Authorizing Provider  ferrous sulfate 324 (65 Fe) MG TBEC Take one tablet daily. 05/11/22   Sherrlyn Hock, MD  lamoTRIgine (LAMICTAL) 200 MG tablet Take 200 mg by mouth daily.  [provider]  levothyroxine (SYNTHROID) 50 MCG tablet Take 1 tablet (50 mcg total) by mouth daily. 05/31/22   Dorna Mai, MD  OLANZapine (ZYPREXA) 10 MG tablet Take 10 mg by mouth at bedtime. 07/06/20   [provider]  omeprazole (PRILOSEC) 20 MG capsule Take 1 capsule (20 mg total) by mouth 2 (two) times daily. 05/31/22   Dorna Mai, MD  polyethylene glycol powder [facility]) 17 GM/SCOOP powder For your constipation cleanout, please mix 4 capfuls of Miralax in 32 ounces of water mixed  with sugar-free Gatorade, Powerade, or a similar sports drink. Give yourself a 1 hour break, then repeat this again. If you do not have clear stool output, please repeat this cleanout. Once you have completed your cleanout, please start taking 1 capful of Miralax once daily mixed in 8 ounces of water, sugar free sports drink, or tea 04/20/22   Elder Love, MD    Family History Family History  Problem Relation Age of Onset   Hypertension Maternal Grandmother    Diabetes Maternal Grandmother    Asthma Maternal Grandmother    Cancer Maternal Grandfather        blood   Diabetes Maternal Grandfather    Hypertension Maternal Grandfather    Heart disease Maternal Grandfather    Hyperlipidemia Maternal Grandfather    Cancer Paternal Grandfather        Lymphoma   Alcohol abuse Neg Hx    Arthritis Neg Hx    Birth defects Neg Hx    COPD Neg Hx    Depression Neg Hx    Drug abuse Neg Hx    Early death Neg Hx    Hearing loss Neg Hx    Kidney disease Neg Hx    Learning disabilities Neg Hx    Mental illness Neg Hx    Mental retardation Neg Hx    Miscarriages / Stillbirths Neg Hx    Stroke Neg Hx    Vision loss Neg Hx    Varicose Veins Neg Hx    Colon cancer Neg Hx    Esophageal cancer Neg Hx    Rectal cancer Neg Hx    Stomach cancer Neg Hx     Social History Social History   Tobacco Use   Smoking status: Never    Passive exposure: Yes   Smokeless tobacco: Never   Tobacco comments:    Mom and dad smoke in home  Vaping Use   Vaping Use: Never used  Substance Use Topics   Alcohol use: No    Alcohol/week: 0.0 standard drinks of alcohol   Drug use: No     Allergies   Patient has no known allergies.   Review of Systems Review of Systems   Physical Exam Triage Vital Signs ED Triage Vitals  Enc Vitals Group     BP 09/29/22 1301 132/82     Pulse Rate 09/29/22 1301 (!) 120     Resp 09/29/22 1301 18     Temp 09/29/22 1301 98 F (36.7 C)     Temp Source 09/29/22 1301  Oral     SpO2 09/29/22 1301 96 %     Weight --      Height --      Head Circumference --      Peak Flow --      Pain Score 09/29/22 1302 6     Pain Loc --      Pain Edu? --      Excl. in [place]? --  No data found.  Updated Vital Signs BP 132/82 (BP Location: Left Arm)   Pulse (!) 120   Temp 98 F (36.7 C) (Oral)   Resp 18   SpO2 96%   Visual Acuity Right Eye Distance:   Left Eye Distance:   Bilateral Distance:    Right Eye Near:   Left Eye Near:    Bilateral Near:     Physical Exam Constitutional:      General: She is not in acute distress.    Appearance: She is obese. She is not toxic-appearing or diaphoretic.  HENT:     Head: Normocephalic and atraumatic.     Right Ear: Tympanic membrane normal.     Left Ear: Tympanic membrane normal.     Nose: Nose normal.     Mouth/Throat:     Mouth: Mucous membranes are moist.  Eyes:     Pupils: Pupils are equal, round, and reactive to light.  Cardiovascular:     Rate and Rhythm: Normal rate and regular rhythm.     Pulses: Normal pulses.     Heart sounds: Normal heart sounds.  Pulmonary:     Effort: Pulmonary effort is normal.     Breath sounds: Normal breath sounds.  Abdominal:     General: Bowel sounds are normal. There is no distension.     Palpations: Abdomen is soft. There is no mass.     Tenderness: There is no abdominal tenderness. There is no right CVA tenderness, left CVA tenderness, guarding or rebound.     Hernia: No hernia is present.     Comments: Increased abdominal girth   Musculoskeletal:        General: Normal range of motion.     Cervical back: Normal range of motion.  Skin:    General: Skin is warm and dry.     Capillary Refill: Capillary refill takes less than 2 seconds.  Neurological:     General: No focal deficit present.     Mental Status: She is alert and oriented to person, place, and time.  Psychiatric:        Mood and Affect: Mood normal.        Behavior: Behavior normal.      UC  Treatments / Results  Labs (all labs ordered are listed, but only abnormal results are displayed) Labs Reviewed  POCT URINALYSIS DIP (MANUAL ENTRY) - Abnormal; Notable for the following components:      Result Value   Ketones, POC UA trace (5) (*)    Spec Grav, UA >=1.030 (*)    Protein Ur, POC =30 (*)    All other components within normal limits    EKG   Radiology DG Abd 1 View  Result Date: 09/29/2022 CLINICAL DATA:  Chronic abdominal pain. EXAM: ABDOMEN - 1 VIEW COMPARISON:  None Available. FINDINGS: No abnormal bowel dilatation. Moderate amount of stool seen throughout the colon. No radio-opaque calculi or other significant radiographic abnormality are seen. Status post surgical fusion of visualized thoracic and upper lumbar spine. IMPRESSION: No abnormal bowel dilatation.  Moderate stool burden. Electronically Signed   By: Marijo Conception M.D.   On: 09/29/2022 13:58    Procedures Procedures (including critical care time)  Medications Ordered in UC Medications - No data to display  Initial Impression / Assessment and Plan / UC Course  I have reviewed the triage vital signs and the nursing notes.  Pertinent labs & imaging results that were available during my care of the  patient were reviewed by me and considered in my medical decision making (see chart for details).     Abdominal pain Final Clinical Impressions(s) / UC Diagnoses   Final diagnoses:  Periumbilical abdominal pain     Discharge Instructions      Your xray is moderate amount of stool seen throughout the colon. Your urinalysis is negative. Continue the stool softener. Encourage the use of a Miralax 1-2 days to help clear the stool burden. The recommendation is to add a stool softener to daily regimen if she is not going to intake adequate amounts of water.   Follow up with PCP if symptoms persist or get worse.         ED Prescriptions   None    PDMP not reviewed this encounter.   Dionisio David Am[place], [place] 09/29/22 [phone]

## 2022-09-29 NOTE — Telephone Encounter (Signed)
  Chief Complaint: Abdominal pain Symptoms: Pain behind belly button Frequency: Tuesday Pertinent Negatives: Patient denies Nausea, vomiting, diarrhea Disposition: '[]'$ ED /'[x]'$ Urgent Care (no appt availability in office) / '[]'$ Appointment(In office/virtual)/ '[]'$  [facility]/ '[]'$ Home Care/ '[]'$ Refused Recommended Disposition /'[]'$ [facility]/ '[]'$  Follow-up with PCP Additional Notes: Spoke with pt's mother. Pt has had intermittent abdominal pain since Tuesday. She states that pain increases each time it returns.  PT states that BM makes pain a little better. PT Hx of h. Pylori. Pt see gastro. This is not cramping.   Reason for Disposition  [1] MILD-MODERATE pain AND [2] constant AND [3] present > 2 hours  Answer Assessment - Initial Assessment Questions 1. LOCATION: "Where does it hurt?"      Behind her belly button.  2. RADIATION: "Does the pain shoot anywhere else?" (e.g., chest, back)     no 3. ONSET: "When did the pain begin?" (e.g., minutes, hours or days ago)      Tuesday 4. SUDDEN: "Gradual or sudden onset?"     Sudden 5. PATTERN "Does the pain come and go, or is it constant?"    - If it comes and goes: "How long does it last?" "Do you have pain now?"     (Note: Comes and goes means the pain is intermittent. It goes away completely between bouts.)    - If constant: "Is it getting better, staying the same, or getting worse?"      (Note: Constant means the pain never goes away completely; most serious pain is constant and gets worse.)      Comes and goes, and getting worse each time 6. SEVERITY: "How bad is the pain?"  (e.g., Scale 1-10; mild, moderate, or severe)    - MILD (1-3): Doesn't interfere with normal activities, abdomen soft and not tender to touch.     - MODERATE (4-7): Interferes with normal activities or awakens from sleep, abdomen tender to touch.     - SEVERE (8-10): Excruciating pain, doubled over, unable to do any normal activities.       6/10 7.  RECURRENT SYMPTOM: "Have you ever had this type of stomach pain before?" If Yes, ask: "When was the last time?" and "What happened that time?"      no 8. CAUSE: "What do you think is causing the stomach pain?"     Unsure 9. RELIEVING/AGGRAVATING FACTORS: "What makes it better or worse?" (e.g., antacids, bending or twisting motion, bowel movement)     Has taken pepto - BM has made pain better 10. OTHER SYMPTOMS: "Do you have any other symptoms?" (e.g., back pain, diarrhea, fever, urination pain, vomiting)       no 11. PREGNANCY: "Is there any chance you are pregnant?" "When was your last menstrual period?"       On depo  Protocols used: Abdominal Pain - [facility]

## 2022-09-29 NOTE — ED Triage Notes (Signed)
Pt reports low abdominal pain x 4 days Pt has a long history of unexplained abdominal problems.

## 2022-09-29 NOTE — Discharge Instructions (Addendum)
Your xray is moderate amount of stool seen throughout the colon. Your urinalysis is negative. Continue the stool softener. Encourage the use of a Miralax 1-2 days to help clear the stool burden. The recommendation is to add a stool softener to daily regimen if she is not going to intake adequate amounts of water.   Follow up with PCP if symptoms persist or get worse.

## 2022-09-29 NOTE — Telephone Encounter (Signed)
Patient has upcoming appt

## 2022-10-06 ENCOUNTER — Ambulatory Visit (INDEPENDENT_AMBULATORY_CARE_PROVIDER_SITE_OTHER): Payer: Medicaid Other

## 2022-10-06 DIAGNOSIS — Z3042 Encounter for surveillance of injectable contraceptive: Secondary | ICD-10-CM | POA: Diagnosis not present

## 2022-10-06 MED ORDER — MEDROXYPROGESTERONE ACETATE 150 MG/ML IM SUSP
150.0000 mg | Freq: Once | INTRAMUSCULAR | Status: AC
  Administered 2022-10-06: 150 mg via INTRAMUSCULAR

## 2022-10-06 NOTE — Progress Notes (Signed)
Patient here for Depo-Provera injection. Still in her window. No c/o side effects. Shot given in R upper outer quadrant, tolerated ok. Next shot due between  May 24-June 7

## 2023-01-04 ENCOUNTER — Encounter: Payer: Self-pay | Admitting: *Deleted

## 2023-01-04 ENCOUNTER — Ambulatory Visit
Admission: EM | Admit: 2023-01-04 | Discharge: 2023-01-04 | Disposition: A | Discharge status: Home / Self Care | Payer: Medicaid Other

## 2023-01-04 ENCOUNTER — Ambulatory Visit: Payer: Self-pay | Admitting: *Deleted

## 2023-01-04 DIAGNOSIS — Z23 Encounter for immunization: Secondary | ICD-10-CM | POA: Diagnosis not present

## 2023-01-04 DIAGNOSIS — T24211A Burn of second degree of right thigh, initial encounter: Secondary | ICD-10-CM | POA: Diagnosis not present

## 2023-01-04 MED ORDER — TETANUS-DIPHTH-ACELL PERTUSSIS 5-2.5-18.5 LF-MCG/0.5 IM SUSY
0.5000 mL | PREFILLED_SYRINGE | Freq: Once | INTRAMUSCULAR | Status: AC
  Administered 2023-01-04: 0.5 mL via INTRAMUSCULAR

## 2023-01-04 MED ORDER — SILVER SULFADIAZINE 1 % EX CREA: 1.0000 | TOPICAL_CREAM | Freq: Every day | CUTANEOUS | 0 refills | Status: DC

## 2023-01-04 NOTE — ED Notes (Signed)
Applied 2 tefla non adherent dressings to pts right inner thigh as well as silva sulfadine cream and tape with coban to secure dressing.  Pt and mom verbalized understanding on how to change dressing and when.

## 2023-01-04 NOTE — ED Provider Notes (Signed)
[name]    CSN: [id number] Arrival date & time: 01/04/23  1047      History   Chief Complaint Chief Complaint  Patient presents with   Burn    HPI [name] is a 22 y.o. female.   Patient presents with mother who helps provide history as patient has history of autism.  They report that she was eating noodles with hot water when they accidentally spilled into her lap about 3 days ago.  She was wearing leggings at the time but they quickly removed them.  Parent reports that they have been cleaning with saline, applying Neosporin topically, and applying dressings.  She has had ibuprofen and Tylenol for pain.  Denies any associated fever.  They are not sure of her last tetanus vaccine.   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   Encounter for medication monitoring 04/23/2020   Major depressive disorder, recurrent episode with mixed features (HCC) 02/10/2020   Suspected autism disorder 11/25/2019   Failed vision screen 11/25/2019   Impaired functional mobility, balance, gait, and endurance 11/25/2019   Irregular  periods/menstrual cycles 11/04/2019   Tachycardia 10/30/2019   Transient alteration of awareness 08/13/2019   Acne vulgaris 06/30/2019   Need for prophylactic vaccination and inoculation against influenza 05/15/2019   Self-imposed food restriction 05/15/2019   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   Verbal auditory hallucinations 05/15/2019   Encounter for well child exam with abnormal findings 02/26/2018   Mood disorder (HCC)    Anorexia 02/20/2018   Learning disability 01/16/2018   Eating disorder    Intermittent epigastric abdominal pain 12/03/2017   Scoliosis 11/06/2016   BMI (body mass index), pediatric, 5% to less than 85% for age 08/02/2015   Adolescent idiopathic scoliosis of thoracolumbar region 06/29/2015   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 Aura Camps   EYE SURGERY N/A    Phreesia 07/19/2020   scoliosis surgery  05/2017   [facility]   SPINE SURGERY N/A    Phreesia 12/30/2019   TYMPANOSTOMY TUBE PLACEMENT     1 1/21 yrs old    OB History   No obstetric history on file.      Home Medications    Prior to Admission medications   Medication Sig Start Date End Date Taking? Authorizing Provider  ferrous sulfate 324 (  65 Fe) MG TBEC Take one tablet daily. 05/11/22  Yes David Stall, MD  lamoTRIgine (LAMICTAL) 200 MG tablet Take 200 mg by mouth daily.   Yes [provider]  levothyroxine (SYNTHROID) 50 MCG tablet Take 1 tablet (50 mcg total) by mouth daily. 05/31/22  Yes Georganna Skeans, MD  medroxyPROGESTERone Acetate (DEPO-PROVERA IM) Inject into the muscle.   Yes [provider]  OLANZapine (ZYPREXA) 10 MG tablet Take 10 mg by mouth at bedtime. 07/06/20  Yes [provider]  omeprazole (PRILOSEC) 20 MG  capsule Take 1 capsule (20 mg total) by mouth 2 (two) times daily. 05/31/22  Yes Georganna Skeans, MD  silver sulfADIAZINE (SILVADENE) 1 % cream Apply 1 Application topically daily. 01/04/23  Yes , Acie Fredrickson, FNP  polyethylene glycol powder (GLYCOLAX/MIRALAX) 17 GM/SCOOP powder For your constipation cleanout, please mix 4 capfuls of Miralax in 32 ounces of water mixed with sugar-free Gatorade, Powerade, or a similar sports drink. Give yourself a 1 hour break, then repeat this again. If you do not have clear stool output, please repeat this cleanout. Once you have completed your cleanout, please start taking 1 capful of Miralax once daily mixed in 8 ounces of water, sugar free sports drink, or tea 04/20/22   [name], MD    Family History Family History  Problem Relation Age of Onset   Hypertension Maternal Grandmother    Diabetes Maternal Grandmother    Asthma Maternal Grandmother    Cancer Maternal Grandfather        blood   Diabetes Maternal Grandfather    Hypertension Maternal Grandfather    Heart disease Maternal Grandfather    Hyperlipidemia Maternal Grandfather    Cancer Paternal Grandfather        Lymphoma   Alcohol abuse Neg Hx    Arthritis Neg Hx    Birth defects Neg Hx    COPD Neg Hx    Depression Neg Hx    Drug abuse Neg Hx    Early death Neg Hx    Hearing loss Neg Hx    Kidney disease Neg Hx    Learning disabilities Neg Hx    Mental illness Neg Hx    Mental retardation Neg Hx    Miscarriages / Stillbirths Neg Hx    Stroke Neg Hx    Vision loss Neg Hx    Varicose Veins Neg Hx    Colon cancer Neg Hx    Esophageal cancer Neg Hx    Rectal cancer Neg Hx    Stomach cancer Neg Hx     Social History Social History   Tobacco Use   Smoking status: Never    Passive exposure: Yes   Smokeless tobacco: Never   Tobacco comments:    Mom and dad smoke in home  Vaping Use   Vaping Use: Never used  Substance Use Topics   Alcohol use: No    Alcohol/week: 0.0 standard  drinks of alcohol   Drug use: No     Allergies   Patient has no known allergies.   Review of Systems Review of Systems Per HPI  Physical Exam Triage Vital Signs ED Triage Vitals  Enc Vitals Group     BP 01/04/23 1244 113/72     Pulse Rate 01/04/23 1244 (!) 104     Resp 01/04/23 1244 16     Temp 01/04/23 1244 98.3 F (36.8 C)     Temp Source 01/04/23 1244 Oral     SpO2 01/04/23 1244  98 %     Weight --      Height --      Head Circumference --      Peak Flow --      Pain Score 01/04/23 1333 2     Pain Loc --      Pain Edu? --      Excl. in GC? --    No data found.  Updated Vital Signs BP 113/72   Pulse (!) 104   Temp 98.3 F (36.8 C) (Oral)   Resp 16   SpO2 98%   Visual Acuity Right Eye Distance:   Left Eye Distance:   Bilateral Distance:    Right Eye Near:   Left Eye Near:    Bilateral Near:     Physical Exam Constitutional:      General: She is not in acute distress.    Appearance: Normal appearance. She is not toxic-appearing or diaphoretic.  HENT:     Head: Normocephalic and atraumatic.  Eyes:     Extraocular Movements: Extraocular movements intact.     Conjunctiva/sclera: Conjunctivae normal.  Pulmonary:     Effort: Pulmonary effort is normal.  Skin:         Comments: Patient has large open area that is approximately 3 inches x 3 inches in diameter present to left medial thigh.  She has surrounding superficial erythema.  No area of swelling or purulent drainage.  Small area of erythema that is separate and directly above large burn.  No blisters noted.  Neurological:     General: No focal deficit present.     Mental Status: She is alert and oriented to person, place, and time. Mental status is at baseline.  Psychiatric:        Mood and Affect: Mood normal.        Behavior: Behavior normal.        Thought Content: Thought content normal.        Judgment: Judgment normal.      UC Treatments / Results  Labs (all labs ordered are listed,  but only abnormal results are displayed) Labs Reviewed - No data to display  EKG   Radiology No results found.  Procedures Procedures (including critical care time)  Medications Ordered in UC Medications  Tdap (BOOSTRIX) injection 0.5 mL (0.5 mLs Intramuscular Given 01/04/23 1322)    Initial Impression / Assessment and Plan / UC Course  I have reviewed the triage vital signs and the nursing notes.  Pertinent labs & imaging results that were available during my care of the patient were reviewed by me and considered in my medical decision making (see chart for details).     Physical exam is consistent with partial-thickness burn.  No signs of complication, no fever, no systemic complications so do not think that emergent evaluation is necessary.  Tetanus vaccine updated today.  Nonadherent dressing with Silvadene cream applied by clinical staff prior to discharge.  Will prescribe Silvadene cream to apply at home and advised of dressing changes.  Advised to monitor for signs of infection and follow-up sooner if they occur.  Parent reports that it is difficult to get in with primary care so encouraged them to follow-up with wound care center or urgent care for further evaluation and management.  If they are not able to get with PCP or wound care center at provided contact information, parent was advised to bring her back to urgent care in 48 to 72 hours for recheck.  She  was also given strict ER precautions.  Parent and patient verbalized understanding and was agreeable with plan. Final Clinical Impressions(s) / UC Diagnoses   Final diagnoses:  Partial thickness burn of right thigh, initial encounter     Discharge Instructions      You have been prescribed an antibiotic cream to apply to burn daily.  Change dressing once to twice daily or as needed if it becomes soiled.  Monitor for signs of infection that include increased redness, swelling, pus.  Follow-up sooner if this occurs.   Please follow-up in 48 to 72 hours to have recheck of burn at urgent care if not able to be seen by wound care center or family medicine.    ED Prescriptions     Medication Sig Dispense Auth. Provider   silver sulfADIAZINE (SILVADENE) 1 % cream Apply 1 Application topically daily. 400 g Gustavus Bryant, [place]      PDMP not reviewed this encounter.   Gustavus Bryant, [place] 01/04/23 1431

## 2023-01-04 NOTE — Telephone Encounter (Signed)
Reason for Disposition  [1] Broken (ruptured) blister AND [2] caller doesn't want to trim the dead skin  Answer Assessment - Initial Assessment Questions 1. ONSET: "When did it happen?" If happened < 3 hours ago, ask: "Did you apply cold water?" If not, give First Aid Advice immediately.      Mother on phone.   Cooking noodles.   She sat down at the table and turned the bowel.  THe bowel fell on her thigh.   She had leggings on.   It's bubbled and red.   It's the majority of her thigh.    I'm putting Neosprin on it and covering it.   I'm cleaning it saline.    2. LOCATION: "Where is the burn located?"      Right thigh.    Monday afternoon 3. BURN SIZE: "How large is the burn?"  The palm is roughly 1% of the total body surface area (BSA).     Half her thigh is burnt 4. SEVERITY OF THE BURN: "Are there any blisters?"      Yes bubbled up and red.   I'm giving her 2 ibuprofen and 1 Tylenol 5. MECHANISM: "Tell me how it happened."     See above 6. PAIN: "Are you having any pain?" "How bad is the pain?" (Scale 1-10; or mild, moderate, severe)   - MILD (1-3): doesn't interfere with normal activities    - MODERATE (4-7): interferes with normal activities or awakens from sleep    - SEVERE (8-10): excruciating pain, unable to do any normal activities      Mild pain 7. INHALATION INJURY: "Were you exposed to any smoke or fumes?" If Yes, ask: "Do you have any cough or difficulty breathing?"     No 8. OTHER SYMPTOMS: "Do you have any other symptoms?" (e.g., headache, nausea)     Bubbled up.   She sat there yelling when it happened.  She's a special needs child.   9. PREGNANCY: "Is there any chance you are pregnant?" "When was your last menstrual period?"     Not asked  Protocols used: [facility]er[facility]

## 2023-01-04 NOTE — ED Triage Notes (Addendum)
Pt reports spilling hot water on right medial thigh while cooking 3 days ago. Large area of skin was blistered, now no intact blisters. Has been cleansing with saline, applying Neosporin and nonadherent dressings, and taking 400mg  IBU with Tyl.

## 2023-01-04 NOTE — Discharge Instructions (Signed)
You have been prescribed an antibiotic cream to apply to burn daily.  Change dressing once to twice daily or as needed if it becomes soiled.  Monitor for signs of infection that include increased redness, swelling, pus.  Follow-up sooner if this occurs.  Please follow-up in 48 to 72 hours to have recheck of burn at urgent care if not able to be seen by wound care center or family medicine.

## 2023-01-04 NOTE — Telephone Encounter (Signed)
  Chief Complaint: Burn on left thigh from hot water.  (Special needs child).  Mother called in.  Spilled hot bowel of noodles on her left thigh. Symptoms: 1/2 of her left thigh is bubbled and red.  Not c/o pain.  Taking ibuprofen and Tylenol for comfort.   Mother is cleaning it with saline and putting Neosporin ointment on it and covering it.   Just wants to have it checked. Frequency: Happened Monday evening. Pertinent Negatives: Patient denies much pain or signs of infection. Disposition: [] ED /[x] Urgent Care (no appt availability in office) / [] Appointment(In office/virtual)/ []  [place]/ [] Home Care/ [] Refused Recommended Disposition /[] [place] [facility]/ []  Follow-up with PCP Additional Notes: No appts. With Primary Care at [facility] with either provider so they are coming to the Urgent care next door to the practice now.

## 2023-01-09 ENCOUNTER — Other Ambulatory Visit: Payer: Self-pay

## 2023-01-09 ENCOUNTER — Ambulatory Visit
Admission: RE | Admit: 2023-01-09 | Discharge: 2023-01-09 | Disposition: A | Discharge status: Home / Self Care | Payer: Medicaid Other | Source: Ambulatory Visit | Attending: Nurse Practitioner | Admitting: Nurse Practitioner

## 2023-01-09 VITALS — BP 112/76 | HR 105 | Temp 97.9°F | Resp 18

## 2023-01-09 DIAGNOSIS — T24211D Burn of second degree of right thigh, subsequent encounter: Secondary | ICD-10-CM | POA: Diagnosis not present

## 2023-01-09 MED ORDER — IBUPROFEN 600 MG PO TABS: 600.0000 mg | ORAL_TABLET | Freq: Four times a day (QID) | ORAL | 0 refills | Status: AC | PRN

## 2023-01-09 MED ORDER — HYDROCODONE-ACETAMINOPHEN 5-325 MG PO TABS: 2.0000 | ORAL_TABLET | Freq: Four times a day (QID) | ORAL | 0 refills | Status: DC | PRN

## 2023-01-09 MED ORDER — SULFAMETHOXAZOLE-TRIMETHOPRIM 800-160 MG PO TABS: 1.0000 | ORAL_TABLET | Freq: Two times a day (BID) | ORAL | 0 refills | Status: AC

## 2023-01-09 MED ORDER — SILVER SULFADIAZINE 1 % EX CREA: 1.0000 | TOPICAL_CREAM | Freq: Two times a day (BID) | CUTANEOUS | 0 refills | Status: DC
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
## 2023-01-09 NOTE — Discharge Instructions (Addendum)
[name]'s wound looks as expected but possibly starting to get infected  We will start her on antibiotics. Make sure she takes them as prescribed. Do not stop until finished  Continue wound care but increase it to twice a day. Clean wound gently with antibacterial soap (e.g. dial) and water or saline. Pat dry. Apply a thin layer of silvadene cream and cover with a nonstick dressing.  Give her the prescribed motrin as needed for mild to moderate pain.  Only use to the prescribed Norco for severe pain.  Do not take baths, swim, or use a hot tub. Showering is ok. Just make sure that the wound is not directly under the water flow. Cover the dressing with saran wrap to avoid getting the wound saturated.  Do not scratch or pick at your wound. Do not break blisters or peel any skin. Do not rub your wound. Follow-up with wound center on Friday as scheduled.

## 2023-01-11 ENCOUNTER — Ambulatory Visit (INDEPENDENT_AMBULATORY_CARE_PROVIDER_SITE_OTHER): Payer: Medicaid Other

## 2023-01-11 DIAGNOSIS — Z3042 Encounter for surveillance of injectable contraceptive: Secondary | ICD-10-CM

## 2023-01-11 MED ORDER — MEDROXYPROGESTERONE ACETATE 150 MG/ML IM SUSY
150.0000 mg | PREFILLED_SYRINGE | INTRAMUSCULAR | Status: AC
Start: 2023-01-11 — End: 2023-01-11
  Administered 2023-01-11: 150 mg via INTRAMUSCULAR

## 2023-01-11 NOTE — Progress Notes (Signed)
Pt here for depo shot, injection given with no complications

## 2023-01-12 ENCOUNTER — Encounter (HOSPITAL_BASED_OUTPATIENT_CLINIC_OR_DEPARTMENT_OTHER): Payer: Medicaid Other | Attending: General Surgery | Admitting: General Surgery

## 2023-01-12 DIAGNOSIS — F909 Attention-deficit hyperactivity disorder, unspecified type: Secondary | ICD-10-CM | POA: Diagnosis not present

## 2023-01-12 DIAGNOSIS — E11622 Type 2 diabetes mellitus with other skin ulcer: Secondary | ICD-10-CM | POA: Insufficient documentation

## 2023-01-12 DIAGNOSIS — X58XXXS Exposure to other specified factors, sequela: Secondary | ICD-10-CM | POA: Diagnosis not present

## 2023-01-12 DIAGNOSIS — F840 Autistic disorder: Secondary | ICD-10-CM | POA: Diagnosis not present

## 2023-01-12 DIAGNOSIS — T24211S Burn of second degree of right thigh, sequela: Secondary | ICD-10-CM | POA: Insufficient documentation

## 2023-01-12 DIAGNOSIS — K219 Gastro-esophageal reflux disease without esophagitis: Secondary | ICD-10-CM | POA: Insufficient documentation
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
## 2023-01-14 NOTE — Progress Notes (Signed)
[name], [name] ([id number]) [id number] Page 1 of 4 Visit Report for 01/12/2023 Abuse Risk Screen Details Patient Name: Date of Service: HA [name]. 01/12/2023 12:45 PM Medical Record Number: [id number] Patient Account Number: [id number] Date of Birth/Sex: Treating RN: 2000/08/11 (22 y.o. [name] Primary Care [name]: [name] Other Clinician: Referring [name]: Treating [name]/Extender: [name] in Treatment: 0 Abuse Risk Screen Items Answer ABUSE RISK SCREEN: Has anyone close to you tried to hurt or harm you recentlyo No Do you feel uncomfortable with anyone in your familyo No Has anyone forced you do things that you didnt want to doo No Electronic Signature(s) Signed: 01/12/2023 3:21:56 PM By: [name] Entered By: [name] on 01/12/2023 12:46:52 -------------------------------------------------------------------------------- Activities of Daily Living Details Patient Name: Date of Service: HA [name]. 01/12/2023 12:45 PM Medical Record Number: [id number] Patient Account Number: [id number] Date of Birth/Sex: Treating RN: 2001-04-18 (22 y.o. [name] Primary Care [name]: [name] Other Clinician: Referring [name]: Treating [name]/Extender: [name] in Treatment: 0 Activities of Daily Living Items Answer Activities of Daily Living (Please select one for each item) Drive Automobile Not Able Marissa Medications ake Completely Able Use Marissa elephone Completely Able Care for Appearance Completely Able Use Marissa oilet Completely Able Bath / Shower Completely Able Dress Self Completely Able Feed Self Completely Able Walk Completely Able Get In / Out Bed Completely Able Housework Completely Able Prepare Meals Completely Able Handle Money Need Assistance Shop for Self Need Assistance Electronic  Signature(s) Signed: 01/12/2023 3:21:56 PM By: [name] Entered By: [name] on 01/12/2023 12:47:18 [name] ([id number]) [id number] Page 2 of 4 -------------------------------------------------------------------------------- Education Screening Details Patient Name: Date of Service: HA [name]. 01/12/2023 12:45 PM Medical Record Number: [id number] Patient Account Number: [id number] Date of Birth/Sex: Treating RN: 2000/08/30 (22 y.o. [name] Primary Care [name]: [name] Other Clinician: Referring [name]: Treating [name]/Extender: [name] in Treatment: 0 Primary Learner Assessed: Patient Learning Preferences/Education Level/Primary Language Learning Preference: Explanation, Demonstration, Video, Printed Material Highest Education Level: High School Preferred Language: English Cognitive Barrier Language Barrier: No Translator Needed: No Memory Deficit: No Emotional Barrier: No Cultural/Religious Beliefs Affecting Medical Care: No Physical Barrier Impaired Vision: No Impaired Hearing: No Decreased Hand dexterity: No Knowledge/Comprehension Knowledge Level: Medium Comprehension Level: Medium Ability to understand written instructions: Medium Ability to understand verbal instructions: Medium Motivation Anxiety Level: Calm Cooperation: Cooperative Education Importance: Acknowledges Need Interest in Health Problems: Asks Questions Perception: Coherent Willingness to Engage in Self-Management Medium Activities: Readiness to Engage in Self-Management Medium Activities: Electronic Signature(s) Signed: 01/12/2023 3:21:56 PM By: [name] Entered By: [name] on 01/12/2023 12:47:42 -------------------------------------------------------------------------------- Fall Risk Assessment Details Patient Name: Date of Service: HA [name],  [name]. 01/12/2023 12:45 PM Medical Record Number: [id number] Patient Account Number: [id number] Date of Birth/Sex: Treating RN: [birth year] (22 y.o. [name] Primary Care Sharlyne Koeneman: [name] Other Clinician: Referring [name]: Treating [name]/Extender: [name] in Treatment: 0 Fall Risk Assessment Items Have you had 2 or more falls in the last 12 monthso 0 No [name], [name] ([id number]) [phone] [id number] Page 3 of 4 Have you had any fall that resulted in injury in the last 12 monthso 0 No FALLS RISK SCREEN History of falling - immediate or within 3 months 0 No Secondary diagnosis (Do you have 2 or more medical diagnoseso) 0 No  Ambulatory aid None/bed rest/wheelchair/nurse 0 Yes Crutches/cane/walker 0 No Furniture 0 No Intravenous therapy Access/Saline/Heparin Lock 0 No Gait/Transferring Normal/ bed rest/ wheelchair 0 Yes Weak (short steps with or without shuffle, stooped but able to lift head while walking, may seek 0 No support from furniture) Impaired (short steps with shuffle, may have difficulty arising from chair, head down, impaired 0 No balance) Mental Status Oriented to own ability 0 Yes Electronic Signature(s) Signed: 01/12/2023 3:21:56 PM By: [name] Entered By: [name] on 01/12/2023 12:47:54 -------------------------------------------------------------------------------- Foot Assessment Details Patient Name: Date of Service: HA [name]. 01/12/2023 12:45 PM Medical Record Number: [id number] Patient Account Number: [id number] Date of Birth/Sex: Treating RN: 02/04/2001 (22 y.o. [name] Primary Care [name]: [name] Other Clinician: Referring [name]: Treating [name]/Extender: [name] in Treatment: 0 Foot Assessment Items Site Locations + = Sensation present, - = Sensation absent, C = Callus, U =  Ulcer R = Redness, W = Warmth, M = Maceration, PU = Pre-ulcerative lesion F = Fissure, S = Swelling, D = Dryness Assessment Right: Left: Other Deformity: No No Prior Foot Ulcer: No No Prior Amputation: No No Charcot Joint: No No Ambulatory Status: Ambulatory Without Help GaitTequia [name], [name] ([id number]) [phone] [id number] Page 4 of 4 Electronic Signature(s) Signed: 01/12/2023 3:21:56 PM By: [name] Entered By: [name] on 01/12/2023 12:48:32 -------------------------------------------------------------------------------- Nutrition Risk Screening Details Patient Name: Date of Service: HA [name]. 01/12/2023 12:45 PM Medical Record Number: [id number] Patient Account Number: [id number] Date of Birth/Sex: Treating RN: [birth year] (22 y.o. [name] Primary Care Coltrane Tugwell: [name] Other Clinician: Referring [name]: Treating Sherisa Gilvin/Extender: [name] in Treatment: 0 Height (in): 60 Weight (lbs): 140 Body Mass Index (BMI): 27.3 Nutrition Risk Screening Items Score Screening NUTRITION RISK SCREEN: I have an illness or condition that made me change the kind and/or amount of food I eat 0 No I eat fewer than two meals per day 0 No I eat few fruits and vegetables, or milk products 0 No I have three or more drinks of beer, liquor or wine almost every day 0 No I have tooth or mouth problems that make it hard for me to eat 0 No I don'Marissa always have enough money to buy the food I need 0 No I eat alone most of the time 0 No I take three or more different prescribed or over-the-counter drugs Marissa day 0 No Without wanting to, I have lost or gained 10 pounds in the last six months 0 No I am not always physically able to shop, cook and/or feed myself 0 No Nutrition Protocols Good Risk Protocol 0 No interventions needed Moderate Risk Protocol High Risk Proctocol Risk Level: Good  Risk Score: 0 Electronic Signature(s) Signed: 01/12/2023 3:21:56 PM By: [name] Entered By: [name] on 01/12/2023 12:48:17

## 2023-01-24 ENCOUNTER — Other Ambulatory Visit: Payer: Self-pay | Admitting: Family Medicine

## 2023-01-25 NOTE — Telephone Encounter (Signed)
Requested Prescriptions  Pending Prescriptions Disp Refills   omeprazole (PRILOSEC) 20 MG capsule [Pharmacy Med Name: OMEPRAZOLE DR 20 MG CAPSULE] 60 capsule 5    Sig: TAKE 1 CAPSULE BY MOUTH TWICE A DAY     Gastroenterology: Proton Pump Inhibitors Passed - 01/24/2023 11:47 PM      Passed - Valid encounter within last 12 months    Recent Outpatient Visits           7 months ago Type 2 diabetes mellitus without complication, without long-term current use of insulin (HCC)   [facility], MD       Future Appointments             In 1 month Louanne Skye, Devoria Albe., NP [facility], [facility]

## 2023-01-26 ENCOUNTER — Encounter (HOSPITAL_BASED_OUTPATIENT_CLINIC_OR_DEPARTMENT_OTHER): Payer: Medicaid Other | Admitting: General Surgery

## 2023-01-26 DIAGNOSIS — T24211S Burn of second degree of right thigh, sequela: Secondary | ICD-10-CM | POA: Diagnosis not present
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
## 2023-02-09 ENCOUNTER — Encounter (HOSPITAL_BASED_OUTPATIENT_CLINIC_OR_DEPARTMENT_OTHER): Payer: MEDICAID | Attending: General Surgery | Admitting: General Surgery

## 2023-02-09 DIAGNOSIS — F840 Autistic disorder: Secondary | ICD-10-CM | POA: Diagnosis not present

## 2023-02-09 DIAGNOSIS — Z833 Family history of diabetes mellitus: Secondary | ICD-10-CM | POA: Insufficient documentation

## 2023-02-09 DIAGNOSIS — E11622 Type 2 diabetes mellitus with other skin ulcer: Secondary | ICD-10-CM | POA: Insufficient documentation
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
## 2023-02-23 ENCOUNTER — Encounter (HOSPITAL_BASED_OUTPATIENT_CLINIC_OR_DEPARTMENT_OTHER): Payer: MEDICAID | Admitting: General Surgery

## 2023-02-23 DIAGNOSIS — E11622 Type 2 diabetes mellitus with other skin ulcer: Secondary | ICD-10-CM | POA: Diagnosis not present
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
## 2023-03-09 ENCOUNTER — Encounter: Payer: Self-pay | Admitting: Family

## 2023-03-09 ENCOUNTER — Ambulatory Visit (INDEPENDENT_AMBULATORY_CARE_PROVIDER_SITE_OTHER): Payer: MEDICAID | Admitting: Family

## 2023-03-09 VITALS — BP 112/79 | HR 97 | Temp 98.8°F | Ht 60.5 in | Wt 156.6 lb

## 2023-03-09 DIAGNOSIS — E039 Hypothyroidism, unspecified: Secondary | ICD-10-CM

## 2023-03-09 DIAGNOSIS — Z1321 Encounter for screening for nutritional disorder: Secondary | ICD-10-CM | POA: Diagnosis not present

## 2023-03-09 DIAGNOSIS — Z130 Encounter for screening for diseases of the blood and blood-forming organs and certain disorders involving the immune mechanism: Secondary | ICD-10-CM | POA: Diagnosis not present

## 2023-03-09 MED ORDER — LEVOTHYROXINE SODIUM 50 MCG PO TABS
50.0000 ug | ORAL_TABLET | Freq: Every day | ORAL | 5 refills | Status: DC
Start: 2023-03-09 — End: 2023-07-17

## 2023-03-09 NOTE — Progress Notes (Signed)
Patient ID: [name], female    DOB: [birth year]  MRN: [id number]  CC: Hypothyroidism Follow-Up  Subjective: [name] is a 22 y.o. female who presents for hypothyroidism follow-up. She is accompanied by her mother.  Her concerns today include:  - Doing well on Levothyroxine, no issues/concerns.  - Reports feeling cold and sleepy throughout the day.  - No further issues/concerns for discussion today.   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   Encounter for medication monitoring 04/23/2020   Major depressive disorder, recurrent episode with mixed features (HCC) 02/10/2020   Suspected autism disorder 11/25/2019   Failed vision screen 11/25/2019   Impaired functional mobility, balance, gait, and endurance 11/25/2019   Irregular periods/menstrual cycles 11/04/2019   Tachycardia 10/30/2019   Transient alteration of awareness 08/13/2019   Acne vulgaris 06/30/2019   Need for prophylactic vaccination and inoculation against influenza 05/15/2019   Self-imposed food restriction 05/15/2019   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   Verbal auditory hallucinations 05/15/2019   Encounter for well child exam with abnormal findings 02/26/2018   Mood disorder (HCC)    Anorexia 02/20/2018   Learning disability 01/16/2018   Eating disorder    Intermittent epigastric abdominal pain 12/03/2017   Scoliosis 11/06/2016   BMI (body mass index), pediatric, 5% to less than 85% for age 65/09/2015   Adolescent idiopathic scoliosis of thoracolumbar region 06/29/2015   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 30 tablet 3   ibuprofen (ADVIL) 600 MG tablet Take 1 tablet (600 mg total) by mouth every 6 (six) hours as needed for mild pain or moderate pain. 30 tablet 0   lamoTRIgine (LAMICTAL) 200 MG tablet Take 200 mg by mouth daily.     medroxyPROGESTERone Acetate (DEPO-PROVERA IM) Inject into the muscle.     OLANZapine (ZYPREXA) 10 MG tablet Take 5 mg by mouth at bedtime.     omeprazole (PRILOSEC) 20 MG capsule TAKE 1 CAPSULE BY MOUTH TWICE A DAY 60 capsule 5   polyethylene glycol powder (GLYCOLAX/MIRALAX) 17 GM/SCOOP powder For your constipation cleanout, please mix 4 capfuls of Miralax in 32 ounces of water mixed with sugar-free Gatorade, Powerade, or a similar sports drink. Give yourself a 1 hour break, then repeat this again. If you do not have clear stool output, please repeat this cleanout. Once you have completed your cleanout, please start taking 1 capful of Miralax once daily mixed in 8 ounces of water, sugar free sports drink, or tea 510 g 0   HYDROcodone-acetaminophen (NORCO/VICODIN) 5-325 MG tablet Take 2 tablets by mouth every 6 (six) hours as needed for severe pain. (Patient not taking: Reported on 03/09/2023) 10 tablet 0   silver sulfADIAZINE (SILVADENE) 1 % cream Apply 1 Application topically 2 (two) times daily. (Patient not taking: Reported on 03/09/2023) 25 g 0   No current facility-administered medications on file prior to visit.    No Known Allergies  Social History   Socioeconomic History   Marital status: Single    Spouse name: Not on file  Number of children: 0   Years of education: Not on file   Highest education level: Not on file  Occupational History   Not on file  Tobacco Use   Smoking status: Never    Passive exposure: Yes   Smokeless tobacco: Never   Tobacco comments:    Mom and dad smoke in home  Vaping Use   Vaping status: Never Used  Substance and Sexual Activity   Alcohol use: No     Alcohol/week: 0.0 standard drinks of alcohol   Drug use: No   Sexual activity: Never    Birth control/protection: Injection  Other Topics Concern   Not on file  Social History Narrative   11/11/19 Lives with mom and brother. Dad no longer in the home. Cat and dog in household. Day school program. Last offically finished school 10th grade.   Social Determinants of Health   Financial Resource Strain: Not on file  Food Insecurity: Not on file  Transportation Needs: Not on file  Physical Activity: Not on file  Stress: Not on file  Social Connections: Not on file  Intimate Partner Violence: Not on file    Family History  Problem Relation Age of Onset   Hypertension Maternal Grandmother    Diabetes Maternal Grandmother    Asthma Maternal Grandmother    Cancer Maternal Grandfather        blood   Diabetes Maternal Grandfather    Hypertension Maternal Grandfather    Heart disease Maternal Grandfather    Hyperlipidemia Maternal Grandfather    Cancer Paternal Grandfather        Lymphoma   Alcohol abuse Neg Hx    Arthritis Neg Hx    Birth defects Neg Hx    COPD Neg Hx    Depression Neg Hx    Drug abuse Neg Hx    Early death Neg Hx    Hearing loss Neg Hx    Kidney disease Neg Hx    Learning disabilities Neg Hx    Mental illness Neg Hx    Mental retardation Neg Hx    Miscarriages / Stillbirths Neg Hx    Stroke Neg Hx    Vision loss Neg Hx    Varicose Veins Neg Hx    Colon cancer Neg Hx    Esophageal cancer Neg Hx    Rectal cancer Neg Hx    Stomach cancer Neg Hx     Past Surgical History:  Procedure Laterality Date   BELPHAROPTOSIS REPAIR Left    Dr. Aura Camps   EYE SURGERY N/A    [facility] 07/19/2020   scoliosis surgery  05/2017   [facility]   SPINE SURGERY N/A    [facility] 12/30/2019   TYMPANOSTOMY TUBE PLACEMENT     1 1/21 yrs old    ROS: Review of Systems Negative except as stated above  PHYSICAL EXAM: BP 112/79   Pulse 97   Temp  98.8 F (37.1 C) (Oral)   Ht 5' 0.5" (1.537 m)   Wt 156 lb 9.6 oz (71 kg)   SpO2 97%   BMI 30.08 kg/m   Physical Exam HENT:     Head: Normocephalic and atraumatic.     Nose: Nose normal.     Mouth/Throat:     Mouth: Mucous membranes are moist.     Pharynx: Oropharynx is clear.  Eyes:     Extraocular Movements: Extraocular movements intact.     Conjunctiva/sclera: Conjunctivae normal.     Pupils: Pupils are  equal, round, and reactive to light.  Cardiovascular:     Rate and Rhythm: Normal rate and regular rhythm.     Pulses: Normal pulses.     Heart sounds: Normal heart sounds.  Pulmonary:     Effort: Pulmonary effort is normal.     Breath sounds: Normal breath sounds.  Musculoskeletal:        General: Normal range of motion.     Cervical back: Normal range of motion and neck supple.  Neurological:     General: No focal deficit present.     Mental Status: She is alert and oriented to person, place, and time.  Psychiatric:        Mood and Affect: Mood normal.        Behavior: Behavior normal.     ASSESSMENT AND PLAN: 1. Hypothyroidism, unspecified type - Continue Levothyroxine as prescribed. Counseled on medication adherence/adverse effects.  - Routine screening.  - Follow-up with primary provider as scheduled.  - TSH - levothyroxine (SYNTHROID) 50 MCG tablet; Take 1 tablet (50 mcg total) by mouth daily.  Dispense: 30 tablet; Refill: 5  2. Screening for deficiency anemia - Routine screening.  - CBC  3. Encounter for vitamin deficiency screening - Routine screening.  - Vitamin D, 25-hydroxy - Vitamin B12   Patient was given the opportunity to ask questions.  Patient verbalized understanding of the plan and was able to repeat key elements of the plan. Patient was given clear instructions to go to Emergency Department or return to medical center if symptoms don't improve, worsen, or new problems develop.The patient verbalized understanding.   Orders Placed This  Encounter  Procedures   TSH   CBC   Vitamin D, 25-hydroxy   Vitamin B12     Requested Prescriptions   Signed Prescriptions Disp Refills   levothyroxine (SYNTHROID) 50 MCG tablet 30 tablet 5    Sig: Take 1 tablet (50 mcg total) by mouth daily.    Return in about 4 weeks (around 04/06/2023) for Follow-Up or next available Georganna Skeans, MD.  Rema Fendt, NP

## 2023-03-09 NOTE — Progress Notes (Signed)
Pt mom states Pt is always sleepy, cold.   Pt mom states thyroid medication has not been adjusted, Pt mom feels like it needs to be adjusted.

## 2023-03-12 ENCOUNTER — Other Ambulatory Visit: Payer: Self-pay | Admitting: Family

## 2023-03-12 DIAGNOSIS — E559 Vitamin D deficiency, unspecified: Secondary | ICD-10-CM

## 2023-03-12 MED ORDER — VITAMIN D (ERGOCALCIFEROL) 1.25 MG (50000 UNIT) PO CAPS
50000.0000 [IU] | ORAL_CAPSULE | ORAL | 0 refills | Status: AC
Start: 2023-03-12 — End: 2023-05-29
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
## 2023-03-13 ENCOUNTER — Encounter: Payer: Self-pay | Admitting: Nurse Practitioner

## 2023-03-13 ENCOUNTER — Ambulatory Visit: Payer: MEDICAID | Attending: Nurse Practitioner | Admitting: Nurse Practitioner

## 2023-03-13 VITALS — BP 100/80 | HR 96 | Ht 60.0 in | Wt 157.4 lb

## 2023-03-13 DIAGNOSIS — R000 Tachycardia, unspecified: Secondary | ICD-10-CM

## 2023-03-13 DIAGNOSIS — G909 Disorder of the autonomic nervous system, unspecified: Secondary | ICD-10-CM

## 2023-03-13 DIAGNOSIS — E032 Hypothyroidism due to medicaments and other exogenous substances: Secondary | ICD-10-CM

## 2023-03-13 DIAGNOSIS — E1165 Type 2 diabetes mellitus with hyperglycemia: Secondary | ICD-10-CM | POA: Diagnosis not present

## 2023-03-13 NOTE — Patient Instructions (Signed)
Medication Instructions:  Your physician recommends that you continue on your current medications as directed. Please refer to the Current Medication list given to you today. *If you need a refill on your cardiac medications before your next appointment, please call your pharmacy*   Lab Work: None ordered   Testing/Procedures: None ordered   Follow-Up: At [facility], you and your health needs are our priority.  As part of our continuing mission to provide you with exceptional heart care, we have created designated Provider Care Teams.  These Care Teams include your primary Cardiologist (physician) and Advanced Practice Providers (APPs -  Physician Assistants and Nurse Practitioners) who all work together to provide you with the care you need, when you need it.  We recommend signing up for the patient portal called "MyChart".  Sign up information is provided on this After Visit Summary.  MyChart is used to connect with patients for Virtual Visits (Telemedicine).  Patients are able to view lab/test results, encounter notes, upcoming appointments, etc.  Non-urgent messages can be sent to your provider as well.   To learn more about what you can do with MyChart, go to [email or web address].    Your next appointment:    FOLLOW UP   Provider:   Armanda Magic, MD     Other Instructions

## 2023-04-06 ENCOUNTER — Ambulatory Visit (INDEPENDENT_AMBULATORY_CARE_PROVIDER_SITE_OTHER): Payer: MEDICAID

## 2023-04-06 DIAGNOSIS — Z3042 Encounter for surveillance of injectable contraceptive: Secondary | ICD-10-CM

## 2023-04-06 MED ORDER — MEDROXYPROGESTERONE ACETATE 150 MG/ML IM SUSP
150.0000 mg | Freq: Once | INTRAMUSCULAR | Status: AC
Start: 2023-04-06 — End: 2023-04-06
  Administered 2023-04-06: 150 mg via INTRAMUSCULAR

## 2023-05-30 ENCOUNTER — Other Ambulatory Visit: Payer: Self-pay | Admitting: Family

## 2023-05-30 DIAGNOSIS — E559 Vitamin D deficiency, unspecified: Secondary | ICD-10-CM

## 2023-06-04 ENCOUNTER — Ambulatory Visit: Payer: MEDICAID | Admitting: Family Medicine

## 2023-06-04 ENCOUNTER — Ambulatory Visit: Payer: MEDICAID

## 2023-06-04 ENCOUNTER — Encounter: Payer: Self-pay | Admitting: Family Medicine

## 2023-06-04 DIAGNOSIS — Z23 Encounter for immunization: Secondary | ICD-10-CM | POA: Diagnosis not present

## 2023-06-05 ENCOUNTER — Ambulatory Visit: Payer: MEDICAID | Admitting: Family Medicine

## 2023-06-06 NOTE — Progress Notes (Signed)
error 

## 2023-07-06 ENCOUNTER — Ambulatory Visit (INDEPENDENT_AMBULATORY_CARE_PROVIDER_SITE_OTHER): Payer: MEDICAID

## 2023-07-06 DIAGNOSIS — Z3042 Encounter for surveillance of injectable contraceptive: Secondary | ICD-10-CM

## 2023-07-06 MED ORDER — MEDROXYPROGESTERONE ACETATE 150 MG/ML IM SUSP
150.0000 mg | Freq: Once | INTRAMUSCULAR | Status: AC
Start: 2023-07-06 — End: 2023-07-06
  Administered 2023-07-06: 150 mg via INTRAMUSCULAR

## 2023-07-17 ENCOUNTER — Other Ambulatory Visit: Payer: Self-pay | Admitting: Family Medicine

## 2023-07-17 DIAGNOSIS — E039 Hypothyroidism, unspecified: Secondary | ICD-10-CM

## 2023-07-17 MED ORDER — LEVOTHYROXINE SODIUM 50 MCG PO TABS
50.0000 ug | ORAL_TABLET | Freq: Every day | ORAL | 5 refills | Status: DC
Start: 2023-07-17 — End: 2024-01-21

## 2023-07-17 MED ORDER — OMEPRAZOLE 20 MG PO CPDR: 20.0000 mg | DELAYED_RELEASE_CAPSULE | Freq: Two times a day (BID) | ORAL | 5 refills | Status: DC

## 2023-07-17 NOTE — Telephone Encounter (Signed)
Medication Refill -  Most Recent Primary Care Visit:  Provider: [facility]  Department[facility]  Visit Type: NURSE VISIT  Date: 07/06/2023  Medication:   Levothyroxzine 50 mcg Omeprozole 20 mg  Has the patient contacted their pharmacy? No (Agent: If no, request that the patient contact the pharmacy for the refill. If patient does not wish to contact the pharmacy document the reason why and proceed with request.) (Agent: If yes, when and what did the pharmacy advise?)  Is this the correct pharmacy for this prescription? Yes If no, delete pharmacy and type the correct one.  This is the patient's preferred pharmacy:  [facility] [address], [place] - [address] [place] [address] Phone: [phone] Fax: [phone]   Has the prescription been filled recently? Yes  Is the patient out of the medication? Yes  Has the patient been seen for an appointment in the last year OR does the patient have an upcoming appointment? Yes  Can we respond through MyChart? No  Agent: Please be advised that Rx refills may take up to 3 business days. We ask that you follow-up with your pharmacy.

## 2023-07-17 NOTE — Telephone Encounter (Signed)
Requested Prescriptions  Pending Prescriptions Disp Refills   levothyroxine (SYNTHROID) 50 MCG tablet 30 tablet 5    Sig: Take 1 tablet (50 mcg total) by mouth daily.     Endocrinology:  Hypothyroid Agents Passed - 07/17/2023  3:24 PM      Passed - TSH in normal range and within 360 days    TSH  Date Value Ref Range Status  03/09/2023 1.750 0.450 - 4.500 uIU/mL Final         Passed - Valid encounter within last 12 months    Recent Outpatient Visits           4 months ago Hypothyroidism, unspecified type   [place] [facility], Amy J, NP   1 year ago Type 2 diabetes mellitus without complication, without long-term current use of insulin (HCC)   [place] [facility], Lauris Poag, MD               omeprazole (PRILOSEC) 20 MG capsule 60 capsule 5    Sig: Take 1 capsule (20 mg total) by mouth 2 (two) times daily.     Gastroenterology: Proton Pump Inhibitors Passed - 07/17/2023  3:24 PM      Passed - Valid encounter within last 12 months    Recent Outpatient Visits           4 months ago Hypothyroidism, unspecified type   [facility], Amy J, NP   1 year ago Type 2 diabetes mellitus without complication, without long-term current use of insulin [facility])    [facility] at [facility], MD

## 2023-09-24 ENCOUNTER — Other Ambulatory Visit: Payer: Self-pay | Admitting: Family Medicine

## 2023-09-24 NOTE — Telephone Encounter (Signed)
 Copied from CRM [id number]. Topic: Clinical - Medication Refill >> Sep 24, 2023  3:42 PM Antwanette L wrote: Most Recent Primary Care Visit:  Provider: [facility]  [facility]  Visit Type: NURSE VISIT  Date: 07/06/2023  Medication: omeprazole (PRILOSEC) 20 MG capsule   Has the patient contacted their pharmacy? Yes. Pharmacy said they couldn't refill the medicine til 10/06/23  Is this the correct pharmacy for this prescription? Yes [id number] [place], [place] - [address] [place] [address] Phone: [phone] Fax: [phone]   Has the prescription been filled recently? No  Is the patient out of the medication? Yes  Has the patient been seen for an appointment in the last year OR does the patient have an upcoming appointment? Yes  Can we respond through MyChart? No. Please contact patient mother [name]) at [phone]  Agent: Please be advised that Rx refills may take up to 3 business days. We ask that you follow-up with your pharmacy.

## 2023-09-25 MED ORDER — OMEPRAZOLE 20 MG PO CPDR: 20.0000 mg | DELAYED_RELEASE_CAPSULE | Freq: Two times a day (BID) | ORAL | 5 refills | Status: DC

## 2023-09-25 NOTE — Telephone Encounter (Signed)
 Requested by interface surescripts.  Requested Prescriptions  Pending Prescriptions Disp Refills   omeprazole (PRILOSEC) 20 MG capsule 60 capsule 5    Sig: Take 1 capsule (20 mg total) by mouth 2 (two) times daily.     Gastroenterology: Proton Pump Inhibitors Passed - 09/25/2023  4:09 PM      Passed - Valid encounter within last 12 months    Recent Outpatient Visits           6 months ago Hypothyroidism, unspecified type   [facility], Amy J, NP   1 year ago Type 2 diabetes mellitus without complication, without long-term current use of insulin [facility])   [place] [facility], MD

## 2023-10-04 ENCOUNTER — Ambulatory Visit: Payer: MEDICAID | Admitting: Family Medicine

## 2023-10-04 VITALS — BP 117/83 | HR 111 | Temp 98.1°F | Resp 16 | Wt 142.8 lb

## 2023-10-04 DIAGNOSIS — Z3042 Encounter for surveillance of injectable contraceptive: Secondary | ICD-10-CM | POA: Diagnosis not present

## 2023-10-04 MED ORDER — MEDROXYPROGESTERONE ACETATE 150 MG/ML IM SUSP
150.0000 mg | Freq: Once | INTRAMUSCULAR | Status: AC
Start: 2023-10-04 — End: 2023-10-04
  Administered 2023-10-04: 150 mg via INTRAMUSCULAR

## 2023-10-04 NOTE — Progress Notes (Signed)
 Mom would like to talk  about daughter on her depo shot. Mom is concern it may cause effect in the long run

## 2023-10-05 ENCOUNTER — Encounter: Payer: Self-pay | Admitting: Family Medicine

## 2023-10-05 NOTE — Progress Notes (Signed)
 Established Patient Office Visit  Subjective    Patient ID: [name], female    DOB: April 28, 2001  Age: 23 y.o. MRN: [id number]  CC:  Chief Complaint  Patient presents with   Medical Management of Chronic Issues    HPI [name] presents with her mother for routine depo injection. Patient denies acute complaints but does have some questions related to recent news that the depo-provera causes brain tumors.   Outpatient Encounter Medications as of 10/04/2023  Medication Sig   amphetamine-dextroamphetamine (ADDERALL XR) 25 MG 24 hr capsule Take by mouth daily.   ferrous sulfate 324 (65 Fe) MG TBEC Take one tablet daily.   ibuprofen (ADVIL) 600 MG tablet Take 1 tablet (600 mg total) by mouth every 6 (six) hours as needed for mild pain or moderate pain.   lamoTRIgine (LAMICTAL) 200 MG tablet Take 200 mg by mouth daily.   levothyroxine (SYNTHROID) 50 MCG tablet Take 1 tablet (50 mcg total) by mouth daily.   medroxyPROGESTERone Acetate (DEPO-PROVERA IM) Inject into the muscle.   OLANZapine (ZYPREXA) 10 MG tablet Take 5 mg by mouth at bedtime.   OLANZapine (ZYPREXA) 5 MG tablet Take 7.5 mg by mouth at bedtime.   omeprazole (PRILOSEC) 20 MG capsule Take 1 capsule (20 mg total) by mouth 2 (two) times daily.   polyethylene glycol powder (GLYCOLAX/MIRALAX) 17 GM/SCOOP powder For your constipation cleanout, please mix 4 capfuls of Miralax in 32 ounces of water mixed with sugar-free Gatorade, Powerade, or a similar sports drink. Give yourself a 1 hour break, then repeat this again. If you do not have clear stool output, please repeat this cleanout. Once you have completed your cleanout, please start taking 1 capful of Miralax once daily mixed in 8 ounces of water, sugar free sports drink, or tea   [EXPIRED] medroxyPROGESTERone (DEPO-PROVERA) injection 150 mg    No facility-administered encounter medications on file as of 10/04/2023.    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 pylori infection 04/01/2020   Poor fetal growth    Ptosis    PTSD (post-traumatic stress disorder)    Scoliosis    Severe protein-calorie malnutrition [name]: less than 60% of standard weight) (HCC)     Past Surgical History:  Procedure Laterality Date   BELPHAROPTOSIS REPAIR Left    Dr. Aura Camps   EYE SURGERY N/A    Phreesia 07/19/2020   sc[facility]osis [facility]  05/2017   [facility]   SPINE SURGERY N/A    Phreesia 12/30/2019   TYMPANOSTOMY TUBE PLACEMENT     1 1/22 yrs old    Family History  Problem Relation Age of Onset   Hypertension Maternal Grandmother    Diabetes Maternal Grandmother    Asthma Maternal Grandmother    Cancer Maternal Grandfather        blood   Diabetes Maternal Grandfather    Hypertension Maternal Grandfather    Heart disease Maternal Grandfather    Hyperlipidemia Maternal Grandfather    Cancer Paternal Grandfather  Lymphoma   Alcohol abuse Neg Hx    Arthritis Neg Hx    Birth defects Neg Hx    COPD Neg Hx    Depression Neg Hx    Drug abuse Neg Hx    Early death Neg Hx    Hearing loss Neg Hx    Kidney disease Neg Hx    Learning disabilities Neg Hx    Mental illness Neg Hx    Mental retardation Neg Hx    Miscarriages / Stillbirths Neg Hx    Stroke Neg Hx    Vision loss Neg Hx    Varicose Veins Neg Hx    Colon cancer Neg Hx    Esophageal cancer Neg Hx    Rectal cancer Neg Hx    Stomach cancer Neg Hx     Social History   Socioeconomic History   Marital status: Single    Spouse name: Not on file   Number of children: 0    Years of education: Not on file   Highest education level: Not on file  Occupational History   Not on file  Tobacco Use   Smoking status: Never    Passive exposure: Yes   Smokeless tobacco: Never   Tobacco comments:    Mom and dad smoke in home  Vaping Use   Vaping status: Never Used  Substance and Sexual Activity   Alcohol use: No    Alcohol/week: 0.0 standard drinks of alcohol   Drug use: No   Sexual activity: Never    Birth control/protection: Injection  Other Topics Concern   Not on file  Social History Narrative   11/11/19 Lives with mom and brother. Dad no longer in the home. Cat and dog in household. Day school program. Last offically finished school 10th grade.   Social Drivers of Corporate investment banker Strain: Not on file  Food Insecurity: Not on file  Transportation Needs: Not on file  Physical Activity: Not on file  Stress: Not on file  Social Connections: Not on file  Intimate Partner Violence: Not on file    Review of Systems  All other systems reviewed and are negative.       Objective    BP 117/83   Pulse (!) 111   Temp 98.1 F (36.7 C) (Oral)   Resp 16   Wt 142 lb 12.8 oz (64.8 kg)   SpO2 97%   BMI 27.89 kg/m   Physical Exam Vitals and nursing note reviewed.  Constitutional:      General: She is not in acute distress. Neck:     Thyroid: No thyromegaly.  Cardiovascular:     Rate and Rhythm: Normal rate and regular rhythm.  Pulmonary:     Effort: Pulmonary effort is normal.     Breath sounds: Normal breath sounds.  Abdominal:     Palpations: Abdomen is soft.     Tenderness: There is no abdominal tenderness.  Musculoskeletal:     Cervical back: Normal range of motion and neck supple.  Neurological:     General: No focal deficit present.     Mental Status: She is alert and oriented to person, place, and time.         Assessment & Plan:   Encounter for surveillance of injectable contraceptive -     medroxyPROGESTERone  Acetate   Questions answered and reassurance given.   Return in about 3 months (around 01/04/2024).   Tommie Raymond, MD

## 2023-12-19 ENCOUNTER — Ambulatory Visit (INDEPENDENT_AMBULATORY_CARE_PROVIDER_SITE_OTHER): Payer: MEDICAID | Admitting: *Deleted

## 2023-12-19 DIAGNOSIS — Z3042 Encounter for surveillance of injectable contraceptive: Secondary | ICD-10-CM

## 2023-12-19 MED ORDER — MEDROXYPROGESTERONE ACETATE 150 MG/ML IM SUSP
150.0000 mg | Freq: Once | INTRAMUSCULAR | Status: AC
Start: 2023-12-19 — End: 2023-12-19
  Administered 2023-12-19: 150 mg via INTRAMUSCULAR

## 2023-12-19 NOTE — Progress Notes (Signed)
 Patient is in office today for a nurse visit for Birth Control Injection. Patient Injection was given in the  Right deltoid. Patient tolerated injection well.

## 2023-12-20 ENCOUNTER — Ambulatory Visit: Payer: MEDICAID

## 2024-01-15 ENCOUNTER — Other Ambulatory Visit: Payer: Self-pay | Admitting: Family Medicine

## 2024-01-15 DIAGNOSIS — E039 Hypothyroidism, unspecified: Secondary | ICD-10-CM

## 2024-02-13 NOTE — Telephone Encounter (Signed)
 Copied from CRM [id number]. Topic: Clinical - Request for Lab/Test Order >> Feb 13, 2024  8:58 AM Larissa RAMAN wrote: Reason for CRM: Patient's mother's, Elmire Amrein,  requesting to have lab work for her thyroid. Please contact for scheduling.

## 2024-02-24 ENCOUNTER — Other Ambulatory Visit: Payer: Self-pay | Admitting: Family Medicine

## 2024-03-05 ENCOUNTER — Ambulatory Visit (INDEPENDENT_AMBULATORY_CARE_PROVIDER_SITE_OTHER): Payer: MEDICAID

## 2024-03-05 DIAGNOSIS — Z3042 Encounter for surveillance of injectable contraceptive: Secondary | ICD-10-CM | POA: Diagnosis not present

## 2024-03-05 MED ORDER — MEDROXYPROGESTERONE ACETATE 150 MG/ML IM SUSP
150.0000 mg | Freq: Once | INTRAMUSCULAR | Status: AC
Start: 2024-03-05 — End: 2024-03-05
  Administered 2024-03-05: 150 mg via INTRAMUSCULAR

## 2024-03-05 NOTE — Progress Notes (Signed)
 Pt presents today with her mother for her depo birth control shot.Pt was given 150 mg depo-provera  and tolerated it will.

## 2024-03-06 ENCOUNTER — Ambulatory Visit: Payer: MEDICAID | Attending: Internal Medicine | Admitting: Internal Medicine

## 2024-03-06 ENCOUNTER — Encounter: Payer: Self-pay | Admitting: Internal Medicine

## 2024-03-06 VITALS — BP 132/80 | HR 105 | Ht 60.0 in | Wt 156.6 lb

## 2024-03-06 DIAGNOSIS — R000 Tachycardia, unspecified: Secondary | ICD-10-CM | POA: Insufficient documentation

## 2024-03-06 NOTE — Patient Instructions (Signed)
 Medication Instructions:  Your physician recommends that you continue on your current medications as directed. Please refer to the Current Medication list given to you today.  *If you need a refill on your cardiac medications before your next appointment, please call your pharmacy*  Lab Work: None ordered   Testing/Procedures: None ordered  Follow-Up: At [facility], you and your health needs are our priority.  As part of our continuing mission to provide you with exceptional heart care, our providers are all part of one team.  This team includes your primary Cardiologist (physician) and Advanced Practice Providers or APPs (Physician Assistants and Nurse Practitioners) who all work together to provide you with the care you need, when you need it.  Your next appointment:   As  needed  Provider:   Danelle Birmingham, MD    We recommend signing up for the patient portal called MyChart.  Sign up information is provided on this After Visit Summary.  MyChart is used to connect with patients for Virtual Visits (Telemedicine).  Patients are able to view lab/test results, encounter notes, upcoming appointments, etc.  Non-urgent messages can be sent to your provider as well.   To learn more about what you can do with MyChart, go to [email or web address].   Thank you for choosing [facility]!!

## 2024-03-06 NOTE — Progress Notes (Signed)
 HPI Ms. Runkles returns today for followup. She is apleasant 23 yo woman with autonomic dysfunction mostly POTS who I saw last a couple of years ago. At that time she admitted to dietary indiscretion and was drinking 3 or more sugar sweetened drinks a day. She has increased her salt intake and is walking some. She notes that her palpitations have improved. No syncope. Her mother thinks her thyroid might be low as she is cold even when it is hot outside. A followup with her primary is pending. No frank syncope.  No Known Allergies   Current Outpatient Medications  Medication Sig Dispense Refill   ibuprofen  (ADVIL ) 600 MG tablet Take 1 tablet (600 mg total) by mouth every 6 (six) hours as needed for mild pain or moderate pain. 30 tablet 0   lamoTRIgine  (LAMICTAL ) 200 MG tablet Take 200 mg by mouth daily.     levothyroxine  (SYNTHROID ) 50 MCG tablet TAKE 1 TABLET BY MOUTH EVERY DAY 90 tablet 1   medroxyPROGESTERone  Acetate (DEPO-PROVERA  IM) Inject into the muscle.     OLANZapine  (ZYPREXA ) 10 MG tablet Take 5 mg by mouth at bedtime.     omeprazole  (PRILOSEC) 20 MG capsule TAKE 1 CAPSULE BY MOUTH TWICE A DAY 180 capsule 1   amphetamine-dextroamphetamine (ADDERALL XR) 25 MG 24 hr capsule Take by mouth daily. (Patient not taking: Reported on 03/06/2024)     ferrous sulfate  324 (65 Fe) MG TBEC Take one tablet daily. (Patient not taking: Reported on 03/06/2024) 30 tablet 3   OLANZapine  (ZYPREXA ) 5 MG tablet Take 7.5 mg by mouth at bedtime. (Patient not taking: Reported on 03/06/2024)     polyethylene glycol powder (GLYCOLAX /MIRALAX ) 17 GM/SCOOP powder For your constipation cleanout, please mix 4 capfuls of Miralax  in 32 ounces of water mixed with sugar-free Gatorade, Powerade, or a similar sports drink. Give yourself a 1 hour break, then repeat this again. If you do not have clear stool output, please repeat this cleanout. Once you have completed your cleanout, please start taking 1 capful of Miralax   once daily mixed in 8 ounces of water, sugar free sports drink, or tea (Patient not taking: Reported on 03/06/2024) 510 g 0   No current facility-administered medications for this visit.     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 pylori infection 04/01/2020   Poor fetal growth    Ptosis    PTSD (post-traumatic stress disorder)    Scoliosis    Severe protein-calorie malnutrition [name]: less than 60% of standard weight) (HCC)     ROS:   All systems reviewed and negative except as noted in the HPI.   Past Surgical History:  Procedure Laterality Date   BELPHAROPTOSIS REPAIR Left    Dr. Ozell Mirza   EYE SURGERY N/A    Phreesia 07/19/2020   scoliosis surgery  05/2017   [facility]   SPINE SURGERY N/A    Phreesia 12/30/2019   TYMPANOSTOMY TUBE PLACEMENT     1 1/23 yrs old  Family History  Problem Relation Age of Onset   Hypertension Maternal Grandmother    Diabetes Maternal Grandmother    Asthma Maternal Grandmother    Cancer Maternal Grandfather        blood   Diabetes Maternal Grandfather    Hypertension Maternal Grandfather    Heart disease Maternal Grandfather    Hyperlipidemia Maternal Grandfather    Cancer Paternal Grandfather        Lymphoma   Alcohol abuse Neg Hx    Arthritis Neg Hx    Birth defects Neg Hx    COPD Neg Hx    Depression Neg Hx    Drug abuse Neg Hx    Early death Neg Hx    Hearing loss Neg Hx    Kidney disease Neg Hx    Learning disabilities Neg Hx    Mental illness Neg Hx    Mental retardation Neg Hx    Miscarriages /  Stillbirths Neg Hx    Stroke Neg Hx    Vision loss Neg Hx    Varicose Veins Neg Hx    Colon cancer Neg Hx    Esophageal cancer Neg Hx    Rectal cancer Neg Hx    Stomach cancer Neg Hx      Social History   Socioeconomic History   Marital status: Single    Spouse name: Not on file   Number of children: 0   Years of education: Not on file   Highest education level: Not on file  Occupational History   Not on file  Tobacco Use   Smoking status: Never    Passive exposure: Yes   Smokeless tobacco: Never   Tobacco comments:    Mom and dad smoke in home  Vaping Use   Vaping status: Never Used  Substance and Sexual Activity   Alcohol use: No    Alcohol/week: 0.0 standard drinks of alcohol   Drug use: No   Sexual activity: Never    Birth control/protection: Injection  Other Topics Concern   Not on file  Social History Narrative   11/11/19 Lives with mom and brother. Dad no longer in the home. Cat and dog in household. Day school program. Last offically finished school 10th grade.   Social Drivers of Corporate investment banker Strain: Not on file  Food Insecurity: Not on file  Transportation Needs: Not on file  Physical Activity: Not on file  Stress: Not on file  Social Connections: Not on file  Intimate Partner Violence: Not on file     BP 132/80   Pulse (!) 105   Ht 5' (1.524 m)   Wt 156 lb 9.6 oz (71 kg)   SpO2 98%   BMI 30.58 kg/m   Physical Exam:  Well appearing NAD HEENT: Unremarkable Neck:  No JVD, no thyromegally Lymphatics:  No adenopathy Back:  No CVA tenderness Lungs:  Clear with no wheezes HEART:  Regular rate rhythm, no murmurs, no rubs, no clicks Abd:  soft, positive bowel sounds, no organomegally, no rebound, no guarding Ext:  2 plus pulses, no edema, no cyanosis, no clubbing Skin:  No rashes no nodules Neuro:  CN II through XII intact, motor grossly intact  EKG - sinus tachy at 105/min   Assess/Plan:  Autonomic dysfunction - we  discussed strategies for increasing the salt in her diet. I have encouraged her to walk/exercise every day. I have asked her to avoid caffeine. Weight gain - she will work on this. She  is encouraged to avoid sugar sweetened beverages.

## 2024-04-15 ENCOUNTER — Ambulatory Visit (INDEPENDENT_AMBULATORY_CARE_PROVIDER_SITE_OTHER): Payer: MEDICAID | Admitting: Family Medicine

## 2024-04-15 ENCOUNTER — Encounter: Payer: Self-pay | Admitting: Family Medicine

## 2024-04-15 VITALS — BP 117/82 | HR 108 | Ht 60.0 in | Wt 146.0 lb

## 2024-04-15 DIAGNOSIS — Z130 Encounter for screening for diseases of the blood and blood-forming organs and certain disorders involving the immune mechanism: Secondary | ICD-10-CM

## 2024-04-15 DIAGNOSIS — Z0000 Encounter for general adult medical examination without abnormal findings: Secondary | ICD-10-CM

## 2024-04-15 DIAGNOSIS — Z136 Encounter for screening for cardiovascular disorders: Secondary | ICD-10-CM | POA: Diagnosis not present

## 2024-04-15 DIAGNOSIS — Z1329 Encounter for screening for other suspected endocrine disorder: Secondary | ICD-10-CM | POA: Diagnosis not present

## 2024-04-15 DIAGNOSIS — Z1159 Encounter for screening for other viral diseases: Secondary | ICD-10-CM

## 2024-04-15 DIAGNOSIS — Z13228 Encounter for screening for other metabolic disorders: Secondary | ICD-10-CM

## 2024-04-15 NOTE — Progress Notes (Unsigned)
 Established Patient Office Visit  Subjective    Patient ID: [name], female    DOB: Oct 23, 2000  Age: 23 y.o. MRN: [id number]  CC:  Chief Complaint  Patient presents with  . Annual Exam    Pt's mother wants blood work done     HPI [employer] presents ***  Outpatient Encounter Medications as of 04/15/2024  Medication Sig  . ibuprofen  (ADVIL ) 600 MG tablet Take 1 tablet (600 mg total) by mouth every 6 (six) hours as needed for mild pain or moderate pain.  . lamoTRIgine  (LAMICTAL ) 200 MG tablet Take 200 mg by mouth daily.  . levothyroxine  (SYNTHROID ) 50 MCG tablet TAKE 1 TABLET BY MOUTH EVERY DAY  . medroxyPROGESTERone  Acetate (DEPO-PROVERA  IM) Inject into the muscle.  . OLANZapine  (ZYPREXA ) 10 MG tablet Take 5 mg by mouth at bedtime.  . omeprazole  (PRILOSEC) 20 MG capsule TAKE 1 CAPSULE BY MOUTH TWICE A DAY  . amphetamine-dextroamphetamine (ADDERALL XR) 25 MG 24 hr capsule Take by mouth daily. (Patient not taking: Reported on 04/15/2024)  . ferrous sulfate  324 (65 Fe) MG TBEC Take one tablet daily. (Patient not taking: Reported on 04/15/2024)  . OLANZapine  (ZYPREXA ) 5 MG tablet Take 7.5 mg by mouth at bedtime. (Patient not taking: Reported on 04/15/2024)  . polyethylene glycol powder (GLYCOLAX /MIRALAX ) 17 GM/SCOOP powder For your constipation cleanout, please mix 4 capfuls of Miralax  in 32 ounces of water mixed with sugar-free Gatorade, Powerade, or a similar sports drink. Give yourself a 1 hour break, then repeat this again. If you do not have clear stool output, please repeat this cleanout. Once you have completed your cleanout, please start taking 1 capful of Miralax  once daily mixed in 8 ounces of water, sugar free sports drink, or tea (Patient not taking: Reported on 04/15/2024)   No facility-administered encounter medications on file as of 04/15/2024.    Past Medical History:  Diagnosis Date  . ADHD (attention deficit hyperactivity disorder)   . Anxiety 12/09/2012    Phreesia 12/30/2019  . Autism spectrum disorder   . 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 Depression    Phreesia 12/30/2019  . Depression    Phreesia 05/04/2020  . Eating disorder   . Fine motor development delay   . Gastritis   . GERD (gastroesophageal reflux disease)    Phreesia 05/04/2020  . H. pylori infection 04/01/2020  . Poor fetal growth   . Ptosis   . PTSD (post-traumatic stress disorder)   . Scoliosis   . Severe protein-calorie malnutrition [name]: less than 60% of standard weight) [name])     Past Surgical History:  Procedure Laterality Date  . BELPHAROPTOSIS REPAIR Left    Dr. Ozell [name]  . EYE SURGERY N/A    Phreesia 07/19/2020  . scoliosis surgery  05/2017   [facility]  . SPINE SURGERY N/A    Phreesia 12/30/2019  . TYMPANOSTOMY TUBE PLACEMENT     1 1/23 yrs old    Family History  Problem Relation Age of Onset  . Hypertension Maternal Grandmother   . Diabetes Maternal Grandmother   . Asthma Maternal Grandmother   . Cancer Maternal Grandfather        blood  . Diabetes Maternal Grandfather   . Hypertension Maternal Grandfather   . Heart disease Maternal Grandfather   . Hyperlipidemia Maternal Grandfather   . Cancer Paternal Grandfather        Lymphoma  . Alcohol abuse Neg Hx   .  Arthritis Neg Hx   . Birth defects Neg Hx   . COPD Neg Hx   . Depression Neg Hx   . Drug abuse Neg Hx   . Early death Neg Hx   . Hearing loss Neg Hx   . Kidney disease Neg Hx   . Learning disabilities Neg Hx   . Mental illness Neg Hx   . Mental retardation Neg Hx   . Miscarriages / Stillbirths Neg Hx   . Stroke Neg Hx   . Vision loss Neg Hx   . Varicose Veins Neg Hx   . Colon cancer Neg Hx   . Esophageal cancer Neg Hx   . Rectal cancer Neg Hx   . Stomach cancer Neg Hx     Social History   Socioeconomic History  . Marital status: Single    Spouse name: Not on file  . Number of children: 0  . Years of  education: Not on file  . Highest education level: Not on file  Occupational History  . Not on file  Tobacco Use  . Smoking status: Never    Passive exposure: Yes  . Smokeless tobacco: Never  . Tobacco comments:    Mom and dad smoke in home  Vaping Use  . Vaping status: Never Used  Substance and Sexual Activity  . Alcohol use: No    Alcohol/week: 0.0 standard drinks of alcohol  . Drug use: No  . Sexual activity: Never    Birth control/protection: Injection  Other Topics Concern  . Not on file  Social History Narrative   11/11/19 Lives with mom and brother. Dad no longer in the home. Cat and dog in household. Day school program. Last offically finished school 10th grade.   Social Drivers of Corporate investment banker Strain: Not on file  Food Insecurity: Not on file  Transportation Needs: Not on file  Physical Activity: Not on file  Stress: Not on file  Social Connections: Not on file  Intimate Partner Violence: Not on file    ROS      Objective    BP 117/82   Pulse (!) 108   Ht 5' (1.524 m)   Wt 146 lb (66.2 kg)   SpO2 98%   BMI 28.51 kg/m   Physical Exam  {Labs (Optional):23779}    Assessment & Plan:   Annual physical exam -     CMP14+EGFR  Screening for deficiency anemia -     CBC with Differential/Platelet  Encounter for screening for cardiovascular disorders -     Lipid panel  Screening for endocrine/metabolic/immunity disorders -     Hemoglobin A1c -     TSH  Need for hepatitis C screening test -     Hepatitis C antibody     No follow-ups on file.   Marissa Raguel SQUIBB, MD

## 2024-04-16 ENCOUNTER — Ambulatory Visit: Payer: Self-pay | Admitting: Family Medicine

## 2024-04-16 ENCOUNTER — Encounter: Payer: Self-pay | Admitting: Family Medicine

## 2024-04-16 LAB — CMP14+EGFR
ALT: 13 IU/L (ref 0–32)
AST: 13 IU/L (ref 0–40)
Albumin: 4.5 g/dL (ref 4.0–5.0)
Alkaline Phosphatase: 84 IU/L (ref 41–116)
BUN/Creatinine Ratio: 10 (ref 9–23)
BUN: 7 mg/dL (ref 6–20)
Bilirubin Total: 0.4 mg/dL (ref 0.0–1.2)
CO2: 20 mmol/L (ref 20–29)
Calcium: 9.5 mg/dL (ref 8.7–10.2)
Chloride: 105 mmol/L (ref 96–106)
Creatinine, Ser: 0.68 mg/dL (ref 0.57–1.00)
Globulin, Total: 2.6 g/dL (ref 1.5–4.5)
Glucose: 91 mg/dL (ref 70–99)
Potassium: 4 mmol/L (ref 3.5–5.2)
Sodium: 142 mmol/L (ref 134–144)
Total Protein: 7.1 g/dL (ref 6.0–8.5)
eGFR: 126 mL/min/1.73 (ref >59)

## 2024-04-16 LAB — TSH: TSH: 0.829 u[IU]/mL (ref 0.450–4.500)

## 2024-04-16 LAB — CBC WITH DIFFERENTIAL/PLATELET
Basophils Absolute: 0.1 x10E3/uL (ref 0.0–0.2)
Basos: 1 %
EOS (ABSOLUTE): 0.2 x10E3/uL (ref 0.0–0.4)
Eos: 2 %
Hematocrit: 44.4 % (ref 34.0–46.6)
Hemoglobin: 13.8 g/dL (ref 11.1–15.9)
Immature Grans (Abs): 0 x10E3/uL (ref 0.0–0.1)
Immature Granulocytes: 0 %
Lymphocytes Absolute: 3.5 x10E3/uL — ABNORMAL HIGH (ref 0.7–3.1)
Lymphs: 33 %
MCH: 26.5 pg — ABNORMAL LOW (ref 26.6–33.0)
MCHC: 31.1 g/dL — ABNORMAL LOW (ref 31.5–35.7)
MCV: 85 fL (ref 79–97)
Monocytes Absolute: 0.5 x10E3/uL (ref 0.1–0.9)
Monocytes: 5 %
Neutrophils Absolute: 6.3 x10E3/uL (ref 1.4–7.0)
Neutrophils: 59 %
Platelets: 256 x10E3/uL (ref 150–450)
RBC: 5.21 x10E6/uL (ref 3.77–5.28)
RDW: 13.1 % (ref 11.7–15.4)
WBC: 10.6 x10E3/uL (ref 3.4–10.8)

## 2024-04-16 LAB — LIPID PANEL
Chol/HDL Ratio: 4.9 ratio — ABNORMAL HIGH (ref 0.0–4.4)
Cholesterol, Total: 177 mg/dL (ref 100–199)
HDL: 36 mg/dL — ABNORMAL LOW (ref >39)
LDL Chol Calc (NIH): 125 mg/dL — ABNORMAL HIGH (ref 0–99)
Triglycerides: 87 mg/dL (ref 0–149)
VLDL Cholesterol Cal: 16 mg/dL (ref 5–40)

## 2024-04-16 LAB — HEPATITIS C ANTIBODY: Hep C Virus Ab: NONREACTIVE

## 2024-04-16 LAB — HEMOGLOBIN A1C
Est. average glucose Bld gHb Est-mCnc: 111 mg/dL
Hgb A1c MFr Bld: 5.5 % (ref 4.8–5.6)

## 2024-04-30 NOTE — Progress Notes (Signed)
 [facility] Progress Note  Patient Name: [name] Medical Record Number: [id number] Date of Service: 04/30/2024 Attending Provider: Zachary VEAR Lenz, DDS Orthodontist: Isaiah Ply, DDS  ASSESSMENT: [name] is a 23 y.o. female with PMH significant for developmental delay now s/p BSSO on 03/17/24. Following a stable post-operative course with good pain control and stable occlusion without a splint. She is appropriate to return to Dr. Ply for post-surgery orthodontics  PLAN: - Advance slowly to regular diet - Return to Dr. Ply for orthodontic care - Return to [facility] clinic in 8 weeks  HPI: [name] is a 23 y.o. female status post above mentioned procedure. The patient reports that they are overall doing well. No concerns today. Seeing Dr. McMillian for orthodontics.  Past Surgical History: Past Surgical History[1]  VITALS: BP 115/78 (BP Site: L Arm, BP Position: Sitting)   Pulse 97   Ht 156.2 cm (5' 1.5)   Wt 67.6 kg (149 lb)   SpO2 100%   BMI 27.70 kg/m   PHYSICAL EXAM: NEURO: CN II - XII grossly intact with bilateral V3 hypoesthesia EXTRAORAL: No significant facial edema. Face is symmetric.  INTRAORAL: Mucous membranes are pink and well-perfused. Bilateral incisions are healing appropriately. Palate is pink and well-perfused. Patient is occluding stably and repeatedly.  Floor of mouth is soft; uvula is midline; oropharynx is clear.  Pre-Op weight: 156 lbs Week 1 weight: 148 lbs  Week 3 weight: 148 lbs Week 6 weight: 149 lbs       [1] Past Surgical History: Procedure Laterality Date  . PR RECONST MANDIBLE,SAG SPLIT+FIXATN Bilateral 03/17/2024   Procedure: RECONSTRUCTION OF MANDIBULAR RAMI AND/OR BODY, SAGITTAL SPLIT; WITH INTERNAL RIGID FIXATION;  Surgeon: Lenz Zachary Shove, DDS;  Location: OR [facility];  Service: Oral Maxillofacial

## 2024-06-02 ENCOUNTER — Ambulatory Visit (INDEPENDENT_AMBULATORY_CARE_PROVIDER_SITE_OTHER): Payer: MEDICAID | Admitting: *Deleted

## 2024-06-02 DIAGNOSIS — Z3042 Encounter for surveillance of injectable contraceptive: Secondary | ICD-10-CM

## 2024-06-02 MED ORDER — MEDROXYPROGESTERONE ACETATE 150 MG/ML IM SUSP
150.0000 mg | Freq: Once | INTRAMUSCULAR | Status: AC
Start: 2024-06-02 — End: 2024-06-02
  Administered 2024-06-02: 150 mg via INTRAMUSCULAR

## 2024-06-02 NOTE — Progress Notes (Signed)
 Patient is in office today for a nurse visit for Birth Control Injection. Patient Injection was given in the  Right deltoid. Patient tolerated injection well.

## 2024-08-10 ENCOUNTER — Other Ambulatory Visit: Payer: Self-pay | Admitting: Family Medicine

## 2024-08-10 DIAGNOSIS — E039 Hypothyroidism, unspecified: Secondary | ICD-10-CM

## 2024-08-12 NOTE — Telephone Encounter (Signed)
 Requested by interface surescripts. Future visit 09/09/24. Requested Prescriptions  Pending Prescriptions Disp Refills   levothyroxine  (SYNTHROID ) 50 MCG tablet [Pharmacy Med Name: LEVOTHYROXINE  50 MCG TABLET] 90 tablet 1    Sig: TAKE 1 TABLET BY MOUTH EVERY DAY     Endocrinology:  Hypothyroid Agents Passed - 08/12/2024  8:52 AM      Passed - TSH in normal range and within 360 days    TSH  Date Value Ref Range Status  04/15/2024 0.829 0.450 - 4.500 uIU/mL Final         Passed - Valid encounter within last 12 months    Recent Outpatient Visits           3 months ago Annual physical exam   [facility], MD   10 months ago Encounter for surveillance of injectable contraceptive   [facility], MD   1 year ago Hypothyroidism, unspecified type   [facility], Amy J, NP   2 years ago Type 2 diabetes mellitus without complication, without long-term current use of insulin  [facility])   [facility], MD

## 2024-08-18 ENCOUNTER — Ambulatory Visit: Payer: MEDICAID

## 2024-08-18 DIAGNOSIS — Z3042 Encounter for surveillance of injectable contraceptive: Secondary | ICD-10-CM

## 2024-08-18 MED ORDER — MEDROXYPROGESTERONE ACETATE 150 MG/ML IM SUSP
150.0000 mg | Freq: Once | INTRAMUSCULAR | Status: AC
  Administered 2024-08-18: 150 mg via INTRAMUSCULAR

## 2024-09-09 ENCOUNTER — Ambulatory Visit: Payer: Self-pay | Admitting: Family Medicine

## 2024-11-17 ENCOUNTER — Ambulatory Visit: Payer: Self-pay
